# Patient Record
Sex: Female | Born: 1960 | Race: Black or African American | Hispanic: No | Marital: Married | Smoking: Never smoker
Health system: Southern US, Community
[De-identification: ages and names within clinical notes are randomized; demographics above are authoritative.]

## PROBLEM LIST (undated history)

## (undated) DIAGNOSIS — I10 Essential (primary) hypertension: Secondary | ICD-10-CM

## (undated) HISTORY — DX: Essential (primary) hypertension: I10

## (undated) HISTORY — PX: CHOLECYSTECTOMY: SHX55

---

## 1998-12-05 ENCOUNTER — Other Ambulatory Visit: Admission: RE | Admit: 1998-12-05 | Discharge: 1998-12-05 | Payer: Self-pay | Admitting: Obstetrics and Gynecology

## 1999-02-01 ENCOUNTER — Encounter: Admission: RE | Admit: 1999-02-01 | Discharge: 1999-05-02 | Payer: Self-pay | Admitting: Family Medicine

## 1999-06-05 ENCOUNTER — Emergency Department (HOSPITAL_COMMUNITY): Admission: EM | Admit: 1999-06-05 | Discharge: 1999-06-05 | Payer: Self-pay | Admitting: Emergency Medicine

## 1999-08-08 ENCOUNTER — Other Ambulatory Visit: Admission: RE | Admit: 1999-08-08 | Discharge: 1999-08-08 | Payer: Self-pay | Admitting: Obstetrics and Gynecology

## 1999-11-27 ENCOUNTER — Inpatient Hospital Stay (HOSPITAL_COMMUNITY): Admission: AD | Admit: 1999-11-27 | Discharge: 1999-11-27 | Payer: Self-pay | Admitting: Obstetrics and Gynecology

## 1999-11-27 ENCOUNTER — Encounter: Payer: Self-pay | Admitting: Obstetrics and Gynecology

## 2000-01-23 ENCOUNTER — Inpatient Hospital Stay (HOSPITAL_COMMUNITY): Admission: AD | Admit: 2000-01-23 | Discharge: 2000-01-26 | Payer: Self-pay | Admitting: Obstetrics and Gynecology

## 2000-01-26 ENCOUNTER — Encounter: Payer: Self-pay | Admitting: Obstetrics and Gynecology

## 2000-02-28 ENCOUNTER — Inpatient Hospital Stay (HOSPITAL_COMMUNITY): Admission: AD | Admit: 2000-02-28 | Discharge: 2000-03-02 | Payer: Self-pay | Admitting: Obstetrics and Gynecology

## 2000-02-28 ENCOUNTER — Encounter (INDEPENDENT_AMBULATORY_CARE_PROVIDER_SITE_OTHER): Payer: Self-pay | Admitting: Specialist

## 2000-03-04 ENCOUNTER — Encounter: Admission: RE | Admit: 2000-03-04 | Discharge: 2000-06-02 | Payer: Self-pay | Admitting: Obstetrics and Gynecology

## 2000-04-10 ENCOUNTER — Other Ambulatory Visit: Admission: RE | Admit: 2000-04-10 | Discharge: 2000-04-10 | Payer: Self-pay | Admitting: Obstetrics and Gynecology

## 2001-04-11 ENCOUNTER — Other Ambulatory Visit: Admission: RE | Admit: 2001-04-11 | Discharge: 2001-04-11 | Payer: Self-pay | Admitting: Obstetrics and Gynecology

## 2002-05-15 ENCOUNTER — Other Ambulatory Visit: Admission: RE | Admit: 2002-05-15 | Discharge: 2002-05-15 | Payer: Self-pay | Admitting: Obstetrics and Gynecology

## 2002-06-08 ENCOUNTER — Ambulatory Visit (HOSPITAL_COMMUNITY): Admission: RE | Admit: 2002-06-08 | Discharge: 2002-06-08 | Payer: Self-pay | Admitting: Obstetrics and Gynecology

## 2002-06-08 ENCOUNTER — Encounter: Payer: Self-pay | Admitting: Obstetrics and Gynecology

## 2003-01-25 ENCOUNTER — Encounter: Payer: Self-pay | Admitting: *Deleted

## 2003-01-25 ENCOUNTER — Inpatient Hospital Stay (HOSPITAL_COMMUNITY): Admission: AD | Admit: 2003-01-25 | Discharge: 2003-01-25 | Payer: Self-pay | Admitting: *Deleted

## 2003-03-20 ENCOUNTER — Encounter: Payer: Self-pay | Admitting: Emergency Medicine

## 2003-03-20 ENCOUNTER — Emergency Department (HOSPITAL_COMMUNITY): Admission: EM | Admit: 2003-03-20 | Discharge: 2003-03-20 | Payer: Self-pay | Admitting: Emergency Medicine

## 2003-09-27 ENCOUNTER — Other Ambulatory Visit: Admission: RE | Admit: 2003-09-27 | Discharge: 2003-09-27 | Payer: Self-pay | Admitting: Obstetrics and Gynecology

## 2004-05-09 ENCOUNTER — Emergency Department (HOSPITAL_COMMUNITY): Admission: EM | Admit: 2004-05-09 | Discharge: 2004-05-09 | Payer: Self-pay | Admitting: Emergency Medicine

## 2004-06-14 ENCOUNTER — Ambulatory Visit (HOSPITAL_COMMUNITY): Admission: RE | Admit: 2004-06-14 | Discharge: 2004-06-14 | Payer: Self-pay | Admitting: Obstetrics and Gynecology

## 2004-09-20 ENCOUNTER — Emergency Department (HOSPITAL_COMMUNITY): Admission: EM | Admit: 2004-09-20 | Discharge: 2004-09-20 | Payer: Self-pay | Admitting: Emergency Medicine

## 2006-07-31 ENCOUNTER — Ambulatory Visit (HOSPITAL_COMMUNITY): Admission: RE | Admit: 2006-07-31 | Discharge: 2006-07-31 | Payer: Self-pay | Admitting: Obstetrics and Gynecology

## 2006-08-06 ENCOUNTER — Emergency Department (HOSPITAL_COMMUNITY): Admission: EM | Admit: 2006-08-06 | Discharge: 2006-08-07 | Payer: Self-pay | Admitting: Emergency Medicine

## 2008-06-03 ENCOUNTER — Ambulatory Visit (HOSPITAL_COMMUNITY): Admission: RE | Admit: 2008-06-03 | Discharge: 2008-06-03 | Payer: Self-pay | Admitting: Obstetrics and Gynecology

## 2010-09-11 ENCOUNTER — Ambulatory Visit (HOSPITAL_COMMUNITY): Admission: RE | Admit: 2010-09-11 | Discharge: 2010-09-11 | Payer: Self-pay | Admitting: Obstetrics and Gynecology

## 2010-10-22 ENCOUNTER — Ambulatory Visit: Payer: Self-pay | Admitting: Diagnostic Radiology

## 2010-10-22 ENCOUNTER — Emergency Department (HOSPITAL_BASED_OUTPATIENT_CLINIC_OR_DEPARTMENT_OTHER): Admission: EM | Admit: 2010-10-22 | Discharge: 2010-10-22 | Payer: Self-pay | Admitting: Emergency Medicine

## 2010-10-24 ENCOUNTER — Observation Stay (HOSPITAL_COMMUNITY): Admission: EM | Admit: 2010-10-24 | Discharge: 2010-10-26 | Payer: Self-pay | Admitting: Emergency Medicine

## 2010-10-25 ENCOUNTER — Encounter (INDEPENDENT_AMBULATORY_CARE_PROVIDER_SITE_OTHER): Payer: Self-pay

## 2011-03-08 ENCOUNTER — Encounter: Payer: Self-pay | Admitting: Internal Medicine

## 2011-03-13 NOTE — Letter (Signed)
Summary: Pre Visit Letter Revised  [facility]  [address] [place], [place] [address]   Phone: [phone]  Fax: [phone]        03/08/2011 MRN: [id number]  Sydney Mcclain [address] [place], [place]  [address]             Procedure Date:  04-19-11 10:30am           Dr Juanda Chance   Direct Colon   Welcome to the Gastroenterology Division at [facility].    You are scheduled to see a nurse for your pre-procedure visit on 04-05-11 at 10am on the 3rd floor at [facility], [address]  We ask that you try to arrive at our office 15 minutes prior to your appointment time to allow for check-in.  Please take a minute to review the attached form.  If you answer "Yes" to one or more of the questions on the first page, we ask that you call the person listed at your earliest opportunity.  If you answer "No" to all of the questions, please complete the rest of the form and bring it to your appointment.    Your nurse visit will consist of discussing your medical and surgical history, your immediate family medical history, and your medications.   If you are unable to list all of your medications on the form, please bring the medication bottles to your appointment and we will list them.  We will need to be aware of both prescribed and over the counter drugs.  We will need to know exact dosage information as well.    Please be prepared to read and sign documents such as consent forms, a financial agreement, and acknowledgement forms.  If necessary, and with your consent, a friend or relative is welcome to sit-in on the nurse visit with you.  Please bring your insurance card so that we may make a copy of it.  If your insurance requires a referral to see a specialist, please bring your referral form from your primary care physician.  No co-pay is required for this nurse visit.     If you cannot keep your appointment, please call [phone] to cancel or reschedule prior to  your appointment date.  This allows [place] the opportunity to schedule an appointment for another patient in need of care.    Thank you for choosing [facility] for your medical needs.  We appreciate the opportunity to care for you.  Please visit [place] at our website  to learn more about our practice.  Sincerely, The Gastroenterology Division

## 2011-03-14 LAB — URINALYSIS, ROUTINE W REFLEX MICROSCOPIC
Bilirubin Urine: NEGATIVE
Glucose, UA: NEGATIVE mg/dL
Ketones, ur: NEGATIVE mg/dL
Nitrite: NEGATIVE
Protein, ur: NEGATIVE mg/dL
Specific Gravity, Urine: 1.026 (ref 1.005–1.030)

## 2011-03-14 LAB — COMPREHENSIVE METABOLIC PANEL
ALT: 18 U/L (ref 0–35)
Albumin: 3.5 g/dL (ref 3.5–5.2)
Albumin: 4.4 g/dL (ref 3.5–5.2)
Alkaline Phosphatase: 94 U/L (ref 39–117)
Alkaline Phosphatase: 161 U/L — ABNORMAL HIGH (ref 39–117)
BUN: 11 mg/dL (ref 6–23)
CO2: 30 mEq/L (ref 19–32)
CO2: 30 mEq/L (ref 19–32)
Chloride: 95 mEq/L — ABNORMAL LOW (ref 96–112)
Creatinine, Ser: 0.98 mg/dL (ref 0.4–1.2)
GFR calc Af Amer: >60 mL/min (ref >60)
GFR calc Af Amer: >60 mL/min (ref >60)
GFR calc non Af Amer: 60 mL/min — ABNORMAL LOW (ref >60)
GFR calc non Af Amer: >60 mL/min (ref >60)
Glucose, Bld: 109 mg/dL — ABNORMAL HIGH (ref 70–99)
Glucose, Bld: 117 mg/dL — ABNORMAL HIGH (ref 70–99)
Potassium: 2.5 mEq/L — CL (ref 3.5–5.1)
Potassium: 3.1 mEq/L — ABNORMAL LOW (ref 3.5–5.1)
Sodium: 138 mEq/L (ref 135–145)
Sodium: 137 mEq/L (ref 135–145)
Total Bilirubin: 0.9 mg/dL (ref 0.3–1.2)
Total Bilirubin: 1 mg/dL (ref 0.3–1.2)
Total Bilirubin: 0.8 mg/dL (ref 0.3–1.2)
Total Protein: 8.3 g/dL (ref 6.0–8.3)

## 2011-03-14 LAB — CBC
HCT: 30.8 % — ABNORMAL LOW (ref 36.0–46.0)
HCT: 33.8 % — ABNORMAL LOW (ref 36.0–46.0)
MCHC: 33.2 g/dL (ref 30.0–36.0)
MCHC: 32.7 g/dL (ref 30.0–36.0)
MCV: 81.1 fL (ref 78.0–100.0)
MCV: 81.8 fL (ref 78.0–100.0)
Platelets: 350 10*3/uL (ref 150–400)
Platelets: 462 10*3/uL — ABNORMAL HIGH (ref 150–400)
RDW: 14.9 % (ref 11.5–15.5)
RDW: 13.9 % (ref 11.5–15.5)

## 2011-03-14 LAB — DIFFERENTIAL
Basophils Relative: 0 % (ref 0–1)
Basophils Relative: 0 % (ref 0–1)
Eosinophils Absolute: 0 10*3/uL (ref 0.0–0.7)
Eosinophils Relative: 0 % (ref 0–5)
Lymphocytes Relative: 17 % (ref 12–46)
Lymphs Abs: 2.6 10*3/uL (ref 0.7–4.0)
Lymphs Abs: 2.5 10*3/uL (ref 0.7–4.0)
Monocytes Absolute: 1.1 10*3/uL — ABNORMAL HIGH (ref 0.1–1.0)
Neutrophils Relative %: 76 % (ref 43–77)
Neutrophils Relative %: 76 % (ref 43–77)

## 2011-03-14 LAB — URINE MICROSCOPIC-ADD ON

## 2011-03-27 ENCOUNTER — Other Ambulatory Visit: Payer: Self-pay | Admitting: Internal Medicine

## 2011-04-05 ENCOUNTER — Ambulatory Visit (AMBULATORY_SURGERY_CENTER): Payer: BC Managed Care – PPO | Admitting: *Deleted

## 2011-04-05 VITALS — Ht 69.0 in | Wt 251.1 lb

## 2011-04-05 DIAGNOSIS — Z1211 Encounter for screening for malignant neoplasm of colon: Secondary | ICD-10-CM

## 2011-04-05 MED ORDER — PEG-KCL-NACL-NASULF-NA ASC-C 100 G PO SOLR: ORAL | Status: DC

## 2011-04-19 ENCOUNTER — Other Ambulatory Visit: Payer: Self-pay | Admitting: Internal Medicine

## 2011-05-14 ENCOUNTER — Encounter: Payer: Self-pay | Admitting: *Deleted

## 2011-05-14 ENCOUNTER — Telehealth: Payer: Self-pay | Admitting: Internal Medicine

## 2011-05-14 NOTE — Telephone Encounter (Signed)
Pt has lost prep instructions.  Procedure date has changed.  Revised prep instructions mailed to pt.  Attempted to leave message for pt, voice mailbox is full. Ezra Sites

## 2011-05-17 ENCOUNTER — Other Ambulatory Visit: Payer: Self-pay | Admitting: Internal Medicine

## 2011-05-18 NOTE — Discharge Summary (Signed)
[facility]  Patient:    Sydney Mcclain                         MRN: [id number] Adm. Date:  [id number] Disc. Date: [id number] Attending:  Silverio Lay A                           Discharge Summary  REASON FOR ADMISSION:         1. Intrauterine pregnancy at 33+ weeks.                               2. Oligohydramnios.                               3. Chronic hypertension.  HISTORY OF PRESENT ILLNESS:   This is a 50 year old married African-American woman, gravida 1, being admitted at 33 weeks 2 days after being evaluated in the office with a decreased amniotic fluid index at 8.2 or 5th percentile and 6.6 or 3rd percentile.  The patient is known for chronic hypertension and is currently being treated with Aldamet 500 mg t.i.d.  She is admitted for complete bed rest, IV hydration, and repeat ultrasound in 48 hours.  HOSPITAL COURSE:              During her admission, she was on continuous monitoring which was always reactive with absence of any decellerations and a baseline down to 130 to 140 beats per minute.  Her Aldamet was decreased to 500 mg b.i.d. and she maintained blood pressure in 140 to 150 over 78 to 91.  Her repeat ultrasound on January 26, 2000, after complete 48 hours of IV fluids revealed a  normal amniotic fluid index at 11.7.  She was discharged home with maintained bed rest, maintained out of work, continue Aldamet 500 mg b.i.d., continue monitoring blood pressure at home, and follow up in the office within the next week with repeat amniotic fluid index study. DD:  02/07/00 TD:  02/07/00 Job: [id number]

## 2011-06-15 ENCOUNTER — Encounter: Payer: Self-pay | Admitting: Internal Medicine

## 2011-06-15 ENCOUNTER — Ambulatory Visit (AMBULATORY_SURGERY_CENTER): Payer: BC Managed Care – PPO | Admitting: Internal Medicine

## 2011-06-15 VITALS — BP 156/92 | HR 120 | Temp 100.5°F | Resp 15 | Ht 69.0 in | Wt 249.0 lb

## 2011-06-15 DIAGNOSIS — Z1211 Encounter for screening for malignant neoplasm of colon: Secondary | ICD-10-CM

## 2011-06-15 MED ORDER — SODIUM CHLORIDE 0.9 % IV SOLN: 500.0000 mL | INTRAVENOUS | Status: AC

## 2011-06-15 MED ORDER — DIPHENHYDRAMINE HCL 50 MG/ML IJ SOLN: 12.5000 mg | Freq: Once | INTRAMUSCULAR | Status: AC

## 2011-06-15 NOTE — Patient Instructions (Signed)
Your procedure was normal.   You will be due for another colonoscopy in 10 years.   Please, resume all of your routine medications.  You also needs to get a sleep test for sleep apnea.  Please, call your primary care doctor for a referral.  Read the handout given to you by your recovery room nurse.   If you have any questions, please call us at [phone].  Thank-you.

## 2011-06-18 ENCOUNTER — Telehealth: Payer: Self-pay | Admitting: *Deleted

## 2011-06-18 NOTE — Telephone Encounter (Signed)
Attempted cell phone/home number. Mailbox full and cannot accept new messages at this time. Attempted work number, no answer.

## 2011-06-19 ENCOUNTER — Encounter: Payer: Self-pay | Admitting: Internal Medicine

## 2011-06-19 NOTE — Progress Notes (Signed)
-----   Message -----    From: Hart Carwin, MD    Sent: 06/18/2011  10:12 PM      To: Daphine Deutscher, RN Subject: sleep apnea study                              No need to schedule sleep study. Pt will contact her PCP for that. ----- Message -----    From: Daphine Deutscher, RN    Sent: 06/18/2011   9:25 AM      To: Hart Carwin, MD  On patient's colonoscopy report on Friday, it recommends a sleep apnea evaluation. Do I need to set this up for patient? Rene Kocher

## 2011-10-15 ENCOUNTER — Encounter (HOSPITAL_BASED_OUTPATIENT_CLINIC_OR_DEPARTMENT_OTHER): Payer: Self-pay | Admitting: Emergency Medicine

## 2011-10-15 ENCOUNTER — Emergency Department (HOSPITAL_BASED_OUTPATIENT_CLINIC_OR_DEPARTMENT_OTHER)
Admission: EM | Admit: 2011-10-15 | Discharge: 2011-10-16 | Disposition: A | Discharge status: Home Health | Payer: BC Managed Care – PPO | Attending: Emergency Medicine | Admitting: Emergency Medicine

## 2011-10-15 DIAGNOSIS — F411 Generalized anxiety disorder: Secondary | ICD-10-CM | POA: Insufficient documentation

## 2011-10-15 DIAGNOSIS — R002 Palpitations: Secondary | ICD-10-CM | POA: Insufficient documentation

## 2011-10-15 DIAGNOSIS — E876 Hypokalemia: Secondary | ICD-10-CM

## 2011-10-15 DIAGNOSIS — I10 Essential (primary) hypertension: Secondary | ICD-10-CM | POA: Insufficient documentation

## 2011-10-15 NOTE — ED Provider Notes (Addendum)
History     CSN: [id number] Arrival date & time: 10/15/2011 11:48 PM  Chief Complaint  Patient presents with  . Anxiety    (Consider location/radiation/quality/duration/timing/severity/associated sxs/prior treatment) HPI Comments: Patient was lying in bed, almost asleep when she started feeling her heart racing and became startled.  She became anxious about this, and then became worried and came in to the emergency department.  She still feels that her heart is racing.  She has no chest pain or shortness of breath.  No nausea or vomiting.  No diaphoresis.  Patient is also worried about her electrolytes since her potassium was low a year ago.  They've since changed her blood pressure medication to a different diuretic.  Patient also notes that she's been doing a new low-carb diet.  Patient does note that her symptoms are slowly getting better without further intervention.  Patient is a 50 y.o. female presenting with anxiety. The history is provided by the patient. No language interpreter was used.  Anxiety This is a new problem. The current episode started less than 1 hour ago. The problem has been gradually improving. Pertinent negatives include no chest pain, no abdominal pain, no headaches and no shortness of breath. The symptoms are aggravated by nothing. The symptoms are relieved by relaxation. She has tried nothing for the symptoms.    Past Medical History  Diagnosis Date  . Hypertension     Past Surgical History  Procedure Date  . Cholecystectomy     Family History  Problem Relation Age of Onset  . Diabetes Mother     History  Substance Use Topics  . Smoking status: Never Smoker   . Smokeless tobacco: Never Used  . Alcohol Use: No    OB History    Grav Para Term Preterm Abortions TAB SAB Ect Mult Living                  Review of Systems  Constitutional: Negative.  Negative for fever and chills.  HENT: Negative.   Eyes: Negative.  Negative for discharge and  redness.  Respiratory: Negative.  Negative for cough and shortness of breath.   Cardiovascular: Positive for palpitations. Negative for chest pain.  Gastrointestinal: Negative.  Negative for nausea, vomiting, abdominal pain and diarrhea.  Genitourinary: Negative.  Negative for dysuria and vaginal discharge.  Musculoskeletal: Negative.  Negative for back pain.  Skin: Negative.  Negative for color change and rash.  Neurological: Negative.  Negative for syncope and headaches.  Hematological: Negative.  Negative for adenopathy.  Psychiatric/Behavioral: Negative for confusion.  All other systems reviewed and are negative.    Allergies  Review of patient's allergies indicates no known allergies.  Home Medications   Current Outpatient Rx  Name Route Sig Dispense Refill  . VITAMIN D 1000 UNITS PO CAPS Oral Take 1,000 Units by mouth daily.      . SUPER HIGH VITAMINS/MINERALS PO TABS Oral Take 1 tablet by mouth daily.      . TRIAMTERENE-HCTZ 37.5-25 MG PO TABS Oral Take 1 tablet by mouth daily.        BP 156/96  Pulse 110  Temp(Src) 98.5 F (36.9 C) (Oral)  Resp 20  Ht 5\' 9"  (1.753 m)  Wt 245 lb (111.131 kg)  BMI 36.18 kg/m2  SpO2 100%  Physical Exam  Constitutional: She is oriented to person, place, and time. She appears well-developed and well-nourished.  Non-toxic appearance. She does not have a sickly appearance.  HENT:  Head: Normocephalic and atraumatic.  Eyes: Conjunctivae, EOM and lids are normal. Pupils are equal, round, and reactive to light. No scleral icterus.  Neck: Trachea normal and normal range of motion. Neck supple.  Cardiovascular: Regular rhythm, S1 normal, S2 normal, normal heart sounds and normal pulses.  Tachycardia present.  Exam reveals no gallop.   No murmur heard. Pulmonary/Chest: Effort normal and breath sounds normal. No respiratory distress. She has no wheezes. She has no rales. She exhibits no tenderness.  Abdominal: Soft. Normal appearance. There is  no tenderness. There is no rebound, no guarding and no CVA tenderness.  Musculoskeletal: Normal range of motion.  Neurological: She is alert and oriented to person, place, and time. She has normal strength.  Skin: Skin is warm, dry and intact. No rash noted.  Psychiatric: She has a normal mood and affect. Her behavior is normal. Judgment and thought content normal.       Patient appears anxious.    ED Course  Procedures (including critical care time)  Results for orders placed during the hospital encounter of 10/15/11  BASIC METABOLIC PANEL      Component Value Range   Sodium 139  135 - 145 (mEq/L)   Potassium 3.1 (*) 3.5 - 5.1 (mEq/L)   Chloride 100  96 - 112 (mEq/L)   CO2 29  19 - 32 (mEq/L)   Glucose, Bld 129 (*) 70 - 99 (mg/dL)   BUN 23  6 - 23 (mg/dL)   Creatinine, Ser 4.54  0.50 - 1.10 (mg/dL)   Calcium 9.5  8.4 - 09.8 (mg/dL)   GFR calc non Af Amer 64 (*) >90 (mL/min)   GFR calc Af Amer 74 (*) >90 (mL/min)   No results found.   Date: 10/16/2011  Rate: 106  Rhythm: sinus tachycardia  QRS Axis: normal  Intervals: normal  ST/T Wave abnormalities: normal  Conduction Disutrbances:none  Narrative Interpretation:   Old EKG Reviewed: unchanged from 10/22/2010     MDM  Patient here with palpitations.  She has no specific symptoms consistent with ACS.  She has mildly low potassium which has been replenished here.  This is unlikely to have significantly impacted her heart rate and cause any palpitations. She did appear anxious but was improving while here in the emergency department.  Given that patient otherwise appears well and is afebrile I feel she is safe for discharge home at this point in time.        Nat Christen, MD 10/16/11 0144  Patient's repeat heart rate on my exam is 85.  Nat Christen, MD 10/16/11 [phone]

## 2011-10-16 ENCOUNTER — Other Ambulatory Visit: Payer: Self-pay

## 2011-10-16 ENCOUNTER — Encounter (HOSPITAL_BASED_OUTPATIENT_CLINIC_OR_DEPARTMENT_OTHER): Payer: Self-pay | Admitting: *Deleted

## 2011-10-16 LAB — BASIC METABOLIC PANEL
BUN: 23 mg/dL (ref 6–23)
Chloride: 100 mEq/L (ref 96–112)
GFR calc Af Amer: 74 mL/min — ABNORMAL LOW (ref >90)
GFR calc non Af Amer: 64 mL/min — ABNORMAL LOW (ref >90)
Potassium: 3.1 mEq/L — ABNORMAL LOW (ref 3.5–5.1)

## 2011-10-16 MED ORDER — POTASSIUM CHLORIDE CRYS ER 20 MEQ PO TBCR
40.0000 meq | EXTENDED_RELEASE_TABLET | Freq: Once | ORAL | Status: AC
  Administered 2011-10-16: 40 meq via ORAL
  Filled 2011-10-16: qty 2

## 2012-01-08 ENCOUNTER — Emergency Department (INDEPENDENT_AMBULATORY_CARE_PROVIDER_SITE_OTHER)
Admission: EM | Admit: 2012-01-08 | Discharge: 2012-01-08 | Disposition: A | Discharge status: Home / Self Care | Payer: BC Managed Care – PPO | Source: Home / Self Care | Attending: Emergency Medicine | Admitting: Emergency Medicine

## 2012-01-08 ENCOUNTER — Encounter (HOSPITAL_COMMUNITY): Payer: Self-pay | Admitting: Emergency Medicine

## 2012-01-08 DIAGNOSIS — W540XXA Bitten by dog, initial encounter: Secondary | ICD-10-CM

## 2012-01-08 DIAGNOSIS — T148XXA Other injury of unspecified body region, initial encounter: Secondary | ICD-10-CM

## 2012-01-08 MED ORDER — RABIES VACCINE, PCEC IM SUSR
1.0000 mL | Freq: Once | INTRAMUSCULAR | Status: AC
  Administered 2012-01-08: 1 mL via INTRAMUSCULAR

## 2012-01-08 NOTE — ED Provider Notes (Signed)
History     CSN: [id number]  Arrival date & time 01/08/12  8657   First MD Initiated Contact with Patient 01/08/12 [phone]      Chief Complaint  Patient presents with  . Rabies Injection    (Consider location/radiation/quality/duration/timing/severity/associated sxs/prior treatment) HPI Comments: The patient is a 51 year old female who is here for rabies injection. She was bitten by a stray dog on December 28th in [place] on the left leg. She got her first rabies vaccine on that day including local infiltration with rabies immune globulin. She was given Augmentin 875 mg a day and her last tetanus vaccine was less than a year ago. She got her second rabies vaccine on the 31st and returns today for her third. The bite wound is healing up well without any evidence of infection. It's only minimally tender to palpation. She still does have a scab covering the wound.   Past Medical History  Diagnosis Date  . Hypertension     Past Surgical History  Procedure Date  . Cholecystectomy     Family History  Problem Relation Age of Onset  . Diabetes Mother     History  Substance Use Topics  . Smoking status: Never Smoker   . Smokeless tobacco: Never Used  . Alcohol Use: No    OB History    Grav Para Term Preterm Abortions TAB SAB Ect Mult Living                  Review of Systems  Musculoskeletal: Negative for myalgias, back pain, joint swelling, arthralgias and gait problem.  Skin: Negative for rash and wound.  Neurological: Negative for weakness and numbness.    Allergies  Review of patient's allergies indicates no known allergies.  Home Medications   Current Outpatient Rx  Name Route Sig Dispense Refill  . TRIAMTERENE-HCTZ 37.5-25 MG PO TABS Oral Take 1 tablet by mouth daily.      Marland Kitchen VITAMIN D 1000 UNITS PO CAPS Oral Take 1,000 Units by mouth daily.      . SUPER HIGH VITAMINS/MINERALS PO TABS Oral Take 1 tablet by mouth daily.        BP 164/96  Pulse 112  Temp(Src)  98.7 F (37.1 C) (Oral)  Resp 20  SpO2 100%  Physical Exam  Nursing note and vitals reviewed. Constitutional: She is oriented to person, place, and time. She appears well-developed and well-nourished. No distress.  Musculoskeletal: Normal range of motion. She exhibits no edema and no tenderness.       There is a small puncture wound on the lateral aspect of the left lower leg which is covered by a scab. There is no surrounding erythema, no purulent drainage, no tenderness to palpation.   Neurological: She is alert and oriented to person, place, and time. She has normal strength and normal reflexes. She displays no atrophy. No sensory deficit. She exhibits normal muscle tone.  Skin: Skin is warm and dry. No rash noted. She is not diaphoretic.    ED Course  Procedures (including critical care time)  Labs Reviewed - No data to display No results found.   1. Dog bite       MDM  The wound is healing up well. She was given her third rabies vaccine today. The recommendation is for her to her fourth on January 12 and her final on your the 26.        Roque Lias, MD 01/08/12 [phone]

## 2012-01-08 NOTE — ED Notes (Signed)
PT STARTED RABIES VACCINE SERIES OUT OF STATE IN [place] @ [facility] ER ON 12/28/2011 WITH F/U 12/31/11, AND WAS TOLD TO RETURN FOR NEXT INJECTION 01/07/12,01/15/12.PT F/U DATES WERE OFF BUT WITH TALKING TO FLOW MANAGER I WAS INFORMED TO TELL PT TO RETURN ON 01/12/12 TO BE ON TRACK.DR. KELLER EXPLAINED TO PT AND SHE VERBALIZED UNDERSTANDING.WILL RETURN SATURDAY

## 2012-01-08 NOTE — ED Notes (Signed)
HERE FOR RABIES INJESTION S/P DOG BITE 12/28/11 WHILE VACATIONING IN [place].PT RECEIVED X 2 INJECTION OUT OF STATE AND HERE FRO 3RD INJECTION.HX HTN CONTROLLED BY PRESCRIBED MEDS.DENIES PAIN

## 2012-01-12 ENCOUNTER — Encounter (HOSPITAL_COMMUNITY): Payer: Self-pay | Admitting: *Deleted

## 2012-01-12 ENCOUNTER — Emergency Department (HOSPITAL_COMMUNITY)
Admission: EM | Admit: 2012-01-12 | Discharge: 2012-01-12 | Disposition: A | Discharge status: Home / Self Care | Payer: BC Managed Care – PPO | Source: Home / Self Care

## 2012-01-12 MED ORDER — RABIES VACCINE, PCEC IM SUSR
1.0000 mL | Freq: Once | INTRAMUSCULAR | Status: AC
  Administered 2012-01-12: 1 mL via INTRAMUSCULAR

## 2012-01-12 MED ORDER — RABIES VACCINE, PCEC IM SUSR
INTRAMUSCULAR | Status: AC
  Filled 2012-01-12: qty 1

## 2012-01-12 NOTE — ED Notes (Signed)
Pt here for final rabies vaccine  

## 2012-07-21 ENCOUNTER — Other Ambulatory Visit (HOSPITAL_COMMUNITY): Payer: Self-pay | Admitting: Obstetrics and Gynecology

## 2012-07-21 DIAGNOSIS — Z1231 Encounter for screening mammogram for malignant neoplasm of breast: Secondary | ICD-10-CM

## 2012-08-06 ENCOUNTER — Ambulatory Visit (HOSPITAL_COMMUNITY): Payer: BC Managed Care – PPO

## 2012-08-22 ENCOUNTER — Ambulatory Visit (HOSPITAL_COMMUNITY)
Admission: RE | Admit: 2012-08-22 | Discharge: 2012-08-22 | Disposition: A | Discharge status: Home / Self Care | Payer: BC Managed Care – PPO | Source: Ambulatory Visit | Attending: Obstetrics and Gynecology | Admitting: Obstetrics and Gynecology

## 2012-08-22 DIAGNOSIS — Z1231 Encounter for screening mammogram for malignant neoplasm of breast: Secondary | ICD-10-CM

## 2014-08-20 ENCOUNTER — Other Ambulatory Visit: Payer: Self-pay

## 2014-08-20 DIAGNOSIS — Z1231 Encounter for screening mammogram for malignant neoplasm of breast: Secondary | ICD-10-CM

## 2014-08-31 ENCOUNTER — Ambulatory Visit
Admission: RE | Admit: 2014-08-31 | Discharge: 2014-08-31 | Disposition: A | Discharge status: Home / Self Care | Payer: BC Managed Care – PPO | Source: Ambulatory Visit

## 2014-08-31 DIAGNOSIS — Z1231 Encounter for screening mammogram for malignant neoplasm of breast: Secondary | ICD-10-CM

## 2020-09-15 ENCOUNTER — Other Ambulatory Visit: Payer: Self-pay | Admitting: Family Medicine

## 2020-09-15 DIAGNOSIS — Z1231 Encounter for screening mammogram for malignant neoplasm of breast: Secondary | ICD-10-CM

## 2020-09-29 ENCOUNTER — Other Ambulatory Visit: Payer: Self-pay

## 2020-09-29 ENCOUNTER — Ambulatory Visit
Admission: RE | Admit: 2020-09-29 | Discharge: 2020-09-29 | Disposition: A | Discharge status: Home / Self Care | Payer: BC Managed Care – PPO | Source: Ambulatory Visit | Attending: Family Medicine | Admitting: Family Medicine

## 2020-09-29 DIAGNOSIS — Z1231 Encounter for screening mammogram for malignant neoplasm of breast: Secondary | ICD-10-CM

## 2021-04-29 IMAGING — MG DIGITAL SCREENING BILAT W/ TOMO W/ CAD
8 of 14 series · 8 of 40 positions shown · non-contrast
Comparison: Previous exam(s).

ACR Breast Density Category a: The breast tissue is almost entirely
fatty.

CLINICAL DATA: Screening.

EXAM:
DIGITAL SCREENING BILATERAL MAMMOGRAM WITH TOMO AND CAD

[L MLO synth-2D (1 of 2)]
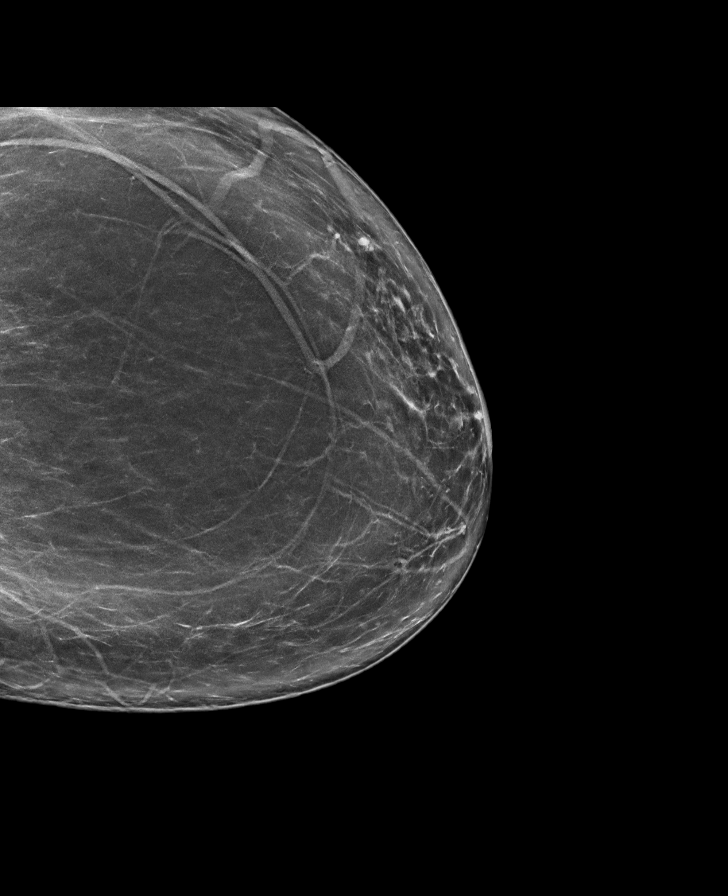

[R CC synth-2D (1 of 2)]
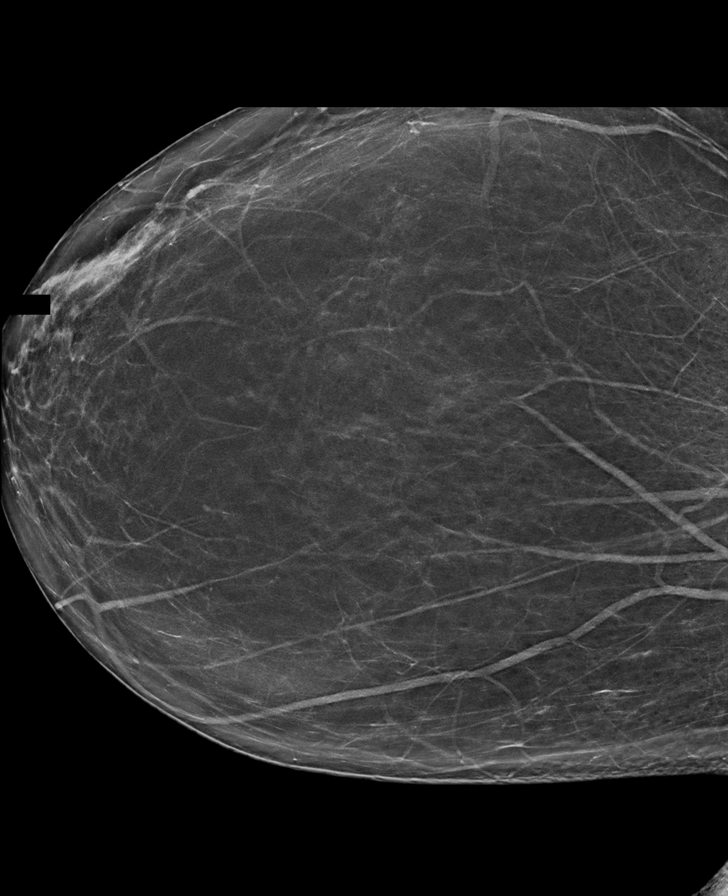

[R MLO synth-2D (1 of 2)]
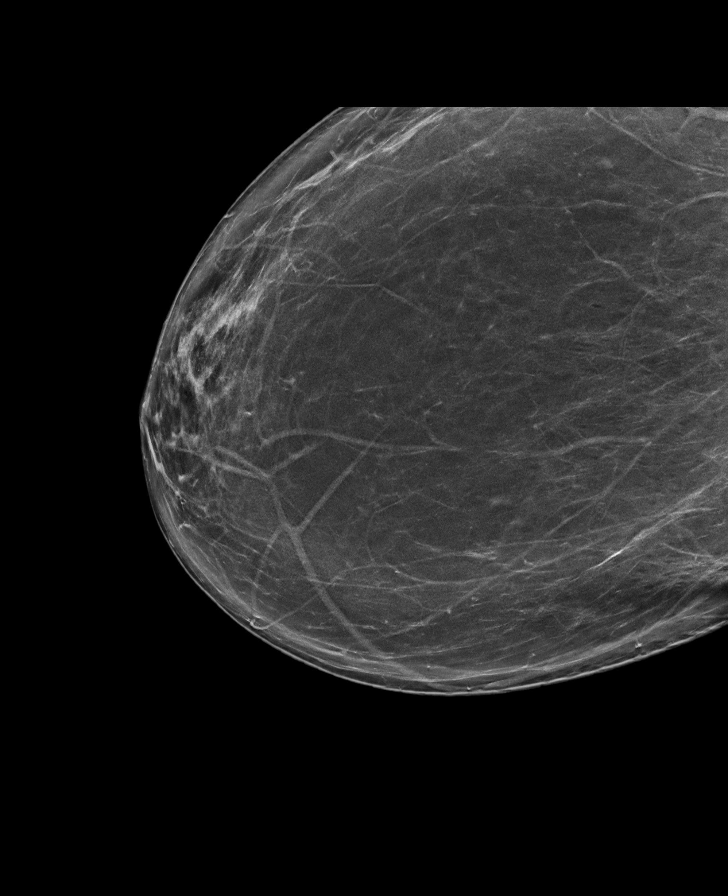

[L CC synth-2D]
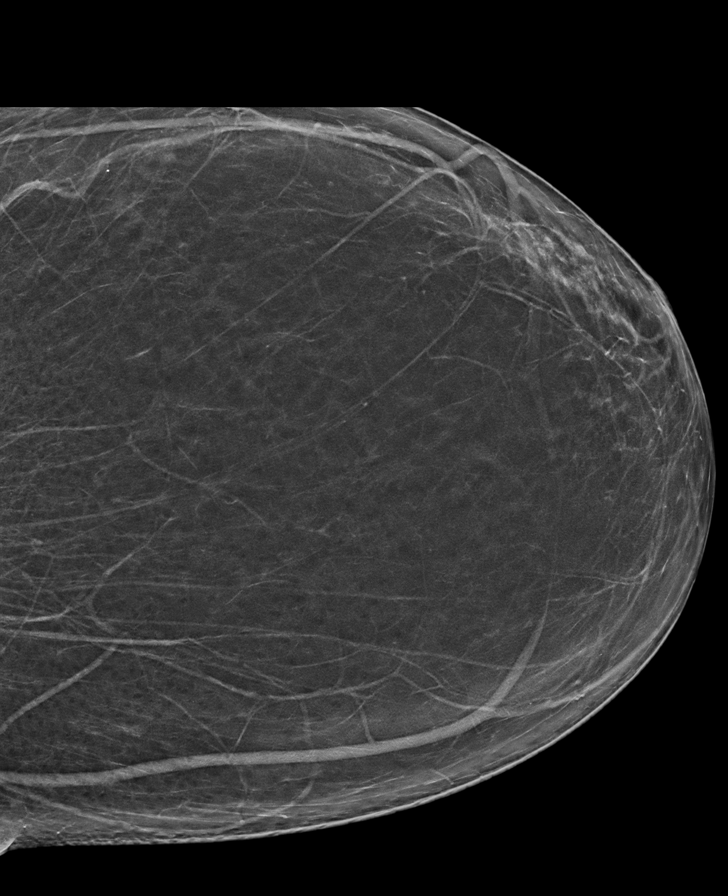

[R MLO synth-2D (2 of 2)]
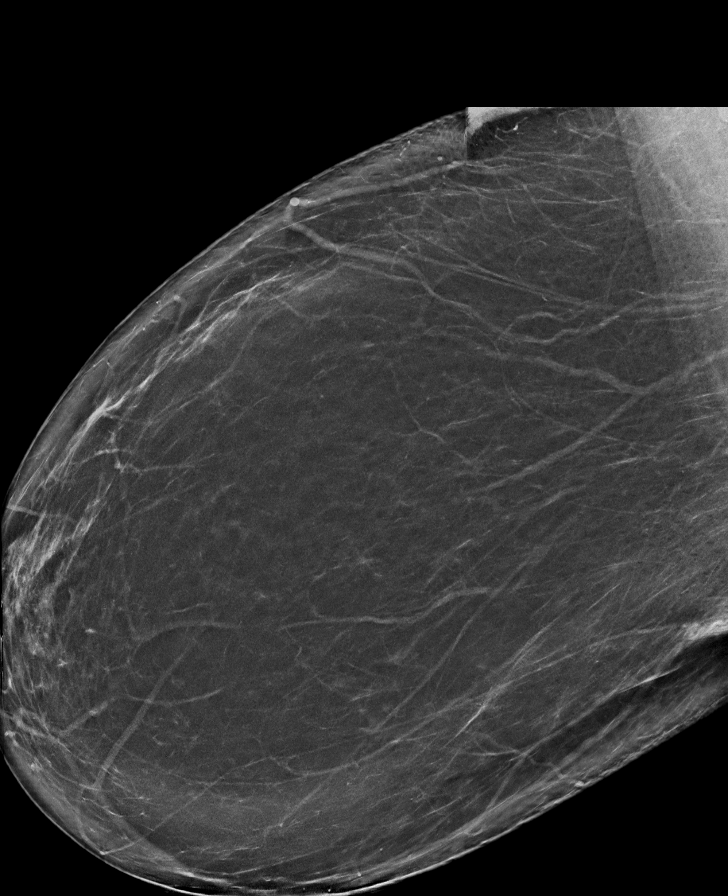

[R CC synth-2D (2 of 2)]
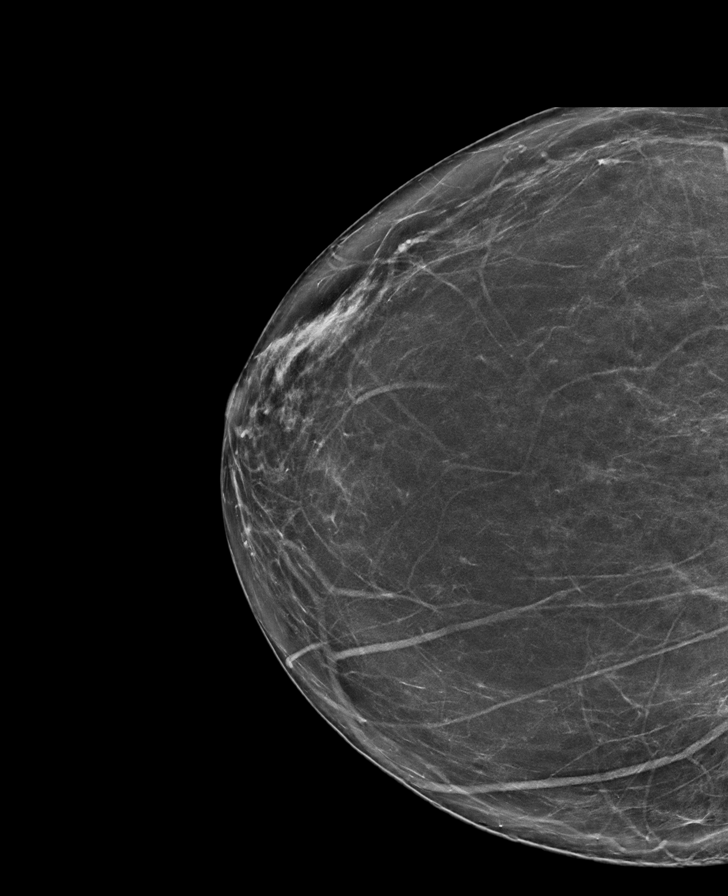

[L MLO synth-2D (2 of 2)]
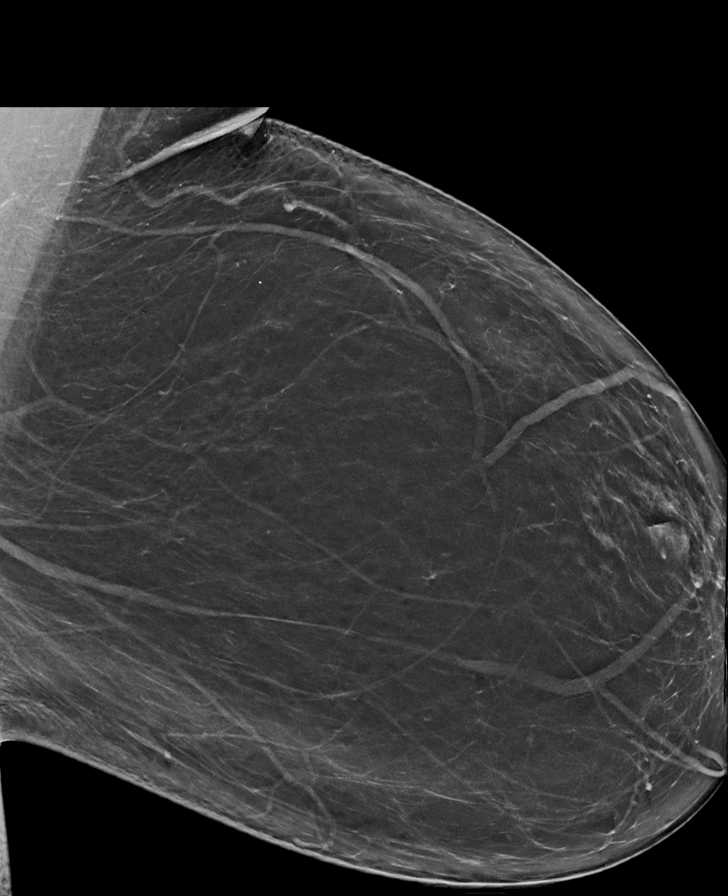

[R CC tomo · tomo slice 40/79.0]
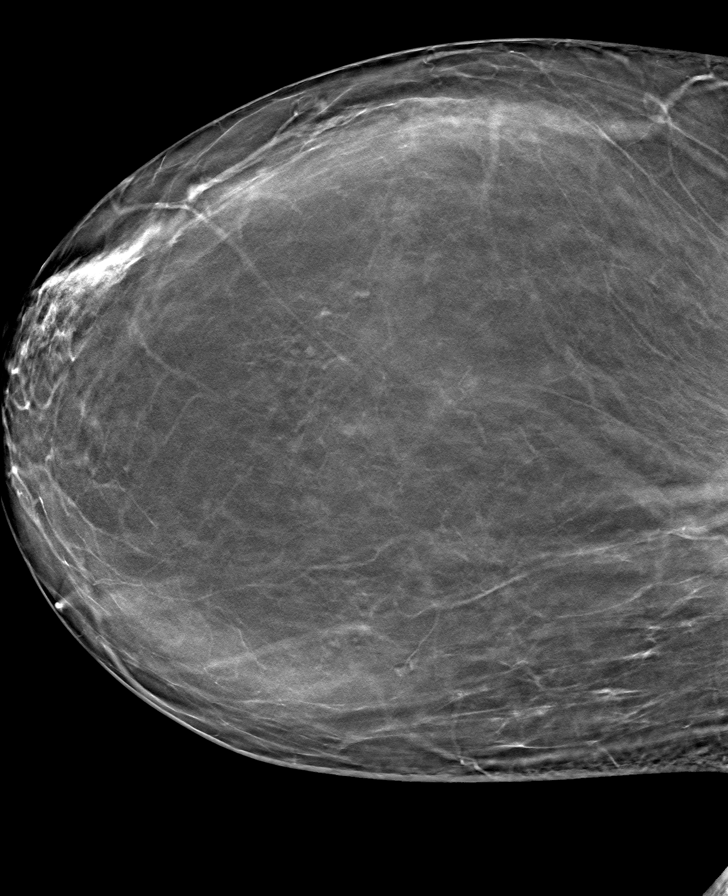

[8 of 40 positions shown; findings below may reference images not displayed]

FINDINGS: There are no findings suspicious for malignancy. Images were
processed with CAD.
IMPRESSION: No mammographic evidence of malignancy. A result letter of this
screening mammogram will be mailed directly to the patient.

RECOMMENDATION:
Screening mammogram in one year. (Code:8Y-Q-VVS)

BI-RADS CATEGORY  1: Negative.

## 2021-09-25 ENCOUNTER — Encounter: Payer: Self-pay | Admitting: Gastroenterology

## 2022-01-10 ENCOUNTER — Other Ambulatory Visit: Payer: Self-pay | Admitting: Family Medicine

## 2022-01-10 DIAGNOSIS — Z1231 Encounter for screening mammogram for malignant neoplasm of breast: Secondary | ICD-10-CM

## 2023-01-07 ENCOUNTER — Other Ambulatory Visit: Payer: Self-pay | Admitting: Family Medicine

## 2023-01-07 DIAGNOSIS — Z1231 Encounter for screening mammogram for malignant neoplasm of breast: Secondary | ICD-10-CM

## 2023-01-30 ENCOUNTER — Ambulatory Visit: Payer: Self-pay | Admitting: Surgery

## 2023-01-30 NOTE — H&P (Signed)
  Sydney Mcclain [id number]   Referring Provider:  Thyra Breed, MD   Subjective   Chief Complaint: New Consultation (Lipoma under Right arm )     History of Present Illness:    Very pleasant 62 year old woman with history of hypertension, hyperlipidemia, diabetes referred for evaluation of a possible lipoma under her right arm.  It has been present for quite some time, and may have slightly increased in size.  It is not painful but it does bother her.  She has had other smaller lipomas along her forearm which she states went away on their own.  No previous surgery or procedures in this area.  She had a laparoscopic cholecystectomy done emergently by Dr. Georgette Dover in 2011.   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    Review of Systems: A complete review of systems was obtained from the patient.  I have reviewed this information and discussed as appropriate with the patient.  See HPI as well for other ROS.   Medical History: Past Medical History:  Diagnosis Date   Arthritis    Diabetes mellitus without complication (CMS-HCC)    Hyperlipidemia    Hypertension    Thyroid disease     There is no problem list on file for this patient.   Past Surgical History:  Procedure Laterality Date   CHOLECYSTECTOMY       No Known Allergies  Current Outpatient Medications on File Prior to Visit  Medication Sig Dispense Refill   atorvastatin (LIPITOR) 10 MG tablet TAKE 1 TABLET(10 MG) BY MOUTH EVERY EVENING FOR CHOLESTEROL     metFORMIN (GLUCOPHAGE) 500 MG tablet TAKE 1 TABLET(500 MG) BY MOUTH WITH THE EVENING MEAL     triamterene-hydroCHLOROthiazide (MAXZIDE-25) 37.5-25 mg tablet Take 1 tablet by mouth once daily     No current facility-administered medications on file prior to visit.    History reviewed. No pertinent family history.   Social History   Tobacco Use  Smoking Status Never  Smokeless Tobacco Never     Social History   Socioeconomic History   Marital  status: Married  Tobacco Use   Smoking status: Never   Smokeless tobacco: Never  Substance and Sexual Activity   Alcohol use: Not Currently   Drug use: Never    Objective:    Vitals:   01/30/23 1504  BP: (!) 160/80  Pulse: 72  Temp: 36.2 C (97.2 F)  SpO2: 98%  Weight: (!) 124.6 kg (274 lb 12.8 oz)  Height: 175.3 cm (5\' 9" )  PainSc: 0-No pain  PainLoc: Arm    Body mass index is 40.58 kg/m.  Gen: A&Ox3, no distress  Unlabored respirations [place], smooth subcutaneous mass on the right posterior medial surface of the upper arm approximately 6-7 cm  Assessment and Plan:  Diagnoses and all orders for this visit:  Subcutaneous mass    Likely lipoma.  Discussed options including ongoing observation versus excision.  She prefers the latter.  We discussed the procedure and risks of bleeding, infection, pain, scarring, wound healing problems, seroma/hematoma, recurrence of the lesion, undesired cosmetic result.  Questions welcomed and answered to her satisfaction.  We will schedule at her convenience.  Erbie Arment Raquel James, MD

## 2023-03-08 ENCOUNTER — Ambulatory Visit: Payer: BC Managed Care – PPO

## 2023-03-14 ENCOUNTER — Ambulatory Visit
Admission: RE | Admit: 2023-03-14 | Discharge: 2023-03-14 | Disposition: A | Discharge status: Home / Self Care | Payer: BC Managed Care – PPO | Source: Ambulatory Visit | Attending: Family Medicine | Admitting: Family Medicine

## 2023-03-14 DIAGNOSIS — Z1231 Encounter for screening mammogram for malignant neoplasm of breast: Secondary | ICD-10-CM

## 2024-02-18 ENCOUNTER — Other Ambulatory Visit: Payer: Self-pay | Admitting: Family Medicine

## 2024-02-18 DIAGNOSIS — Z1231 Encounter for screening mammogram for malignant neoplasm of breast: Secondary | ICD-10-CM

## 2024-03-31 NOTE — Progress Notes (Addendum)
 [facility] Note   HPI:  This is a 63 y.o. female who presents for follow up for multiple thyroid nodules s/p FNAB x2 with benign results, type 2 diabetes and obesity  Patient was seen for initial consultation for multiple thyroid nodules in May 2024.  She previously had imaging with multiple nodules in the isthmus and left thyroid lobe which were increasing in size.  Fine-needle aspiration biopsy was done in March 2022 with the isthmus nodule consistent with a benign follicular nodule however the left nodule was an insufficient specimen.  No repeat biopsy was obtained.  I repeated her ultrasound in June 2024 which demonstrated slight growth and recommended repeat biopsy or consultation with endocrine surgery. She previously met with the ENT surgeon due to compressive symptoms and was recommended for lobectomy.  In regards to diabetes she was diagnosed with prediabetes initially she is on metformin 500 mg once daily with A1c of 6.9% on metformin.  She eats 2-3 meals a day and 2-3 snacks per day.  Denies any complications from diabetes.  She motivated to lose weight to improve glycemic control.  We started Rybelsus in May 2024 and she had improvement in her A1c as well as significant amount of weight loss.  She is tolerating the medication without side effects  Patient was consented for utilization of ambient recorder DAX Copilot tool for preparation of note for today's encounter. Note reviewed and edited by provider before finalizing.   LV 10/2023  Patient was consented for utilization of ambient recorder DAX Copilot tool for preparation of note for today's encounter. Note reviewed and edited by provider before finalizing.   Interval History 03/31/2024: History of Present Illness The patient is a 63 year old female who presents for follow-up of multiple thyroid nodules status post one benign biopsy and type 2 diabetes. She was last seen in October 2024, at which time her Rybelsus  dosage was increased, and she is here today for a follow-up.  She reports overall good health but has experienced a weight gain of 4 pounds, which she attributes to increased stress levels and potential emotional eating. She expresses uncertainty about the continuation of Rybelsus and wishes to monitor her condition before making a decision. She acknowledges that Rybelsus is not a weight loss medication but believes it, in combination with metformin, has positively impacted her blood sugar levels. She does not regularly test her blood sugar levels and is considering continuous monitoring. She has previously discussed the possibility of thyroid surgery and is contemplating it again, given her current focus on work and mental preparation. She expresses concern about the potential impact of Rybelsus on her thyroid, given her existing nodules. She also mentions cravings for sweets and crunchy foods. She has been engaging in physical activity, specifically walking approximately 2.5 miles, and occasionally suspects hypoglycemia, although she is unsure. She notes that her symptoms improve upon eating.  Wants to start CGM Rybelsus and considering weight management program.   MEDICATIONS Rybelsus, metformin  Patient denies a family history of thyroid issues.  Patient denies personal history of head or neck radiation.   Patient reports compressive symptoms, including swelling of anterior neck, hoarseness of voice but denies trouble swallowing.    Patient denies biotin use  Wt Readings from Last 3 Encounters:  03/31/24 117 kg (257 lb 12.8 oz)  02/17/24 116 kg (256 lb 8 oz)  10/01/23 119 kg (261 lb 12.8 oz)   Review of Systems:  Pertinent positives noted in the HPI  PMH,  PSH, Social Hx, Family Hx, Meds and Allergies   I have independently reviewed the patient's past medical history, past surgical history, medication list, and social history noted below.   Social History  Married.  Employed as  professor. Tobacco Use  . Smoking status: Never    Passive exposure: Never  . Smokeless tobacco: Never  Substance Use Topics  . Alcohol use: No  . Drug use: No   Medications: Current Outpatient Medications  Medication Instructions  . atorvastatin (LIPITOR) 10 mg tablet TAKE 1 TABLET(10 MG) BY MOUTH EVERY EVENING FOR CHOLESTEROL  . blood-glucose sensor (FreeStyle Libre 3 Plus Sensor) Place one sensor every 15 days.  SABRA glucose blood (Precision Q-I-D Test) test strip 1 strip, miscellaneous, Daily  . Lancets misc 1 Stick, miscellaneous, Daily  . metFORMIN (GLUCOPHAGE) 500 mg, oral, Daily with meal  . multivitamin-min-iron-FA-vit K (Multi-Day Plus Minerals) 18 mg iron-400 mcg-25 mcg tab 1 tablet, Daily  . omega-3 360-1,200 mg cap Take  by mouth.  . potassium chloride  (KLOR-CON ) 20 mEq ER tablet TAKE 1 TABLET(20 MEQ) BY MOUTH DAILY  . semaglutide (RYBELSUS) 14 mg, oral, Every 24 hours  . triamterene-hydroCHLOROthiazide (MAXZIDE-25) 37.5-25 mg per tablet TAKE 1 TABLET BY MOUTH DAILY   Allergies: No Known Allergies  Objective Data  Vital Signs: BP 144/80 Comment: recheck  Pulse 98   Ht 1.715 m (5' 7.5)   Wt 117 kg (257 lb 12.8 oz)   SpO2 100%   BMI 39.78 kg/m   Physical Exam: General: Alert, pleasant, well-nourished, no acute distress HEENT:  NCAT, EOMI, vision grossly intact, no proptosis or lid lag  Thyroid: Visible and palpable thyromegaly, nodule palpated moves easy with swallow CV: RRR, normal S1, S2, with no murmurs, rubs or gallops Resp: Normal respiratory effort, lungs clear to auscultation bilaterally Neuro: CN2-12 grossly intact, answers questions appropriately Pysch: Pleasant mood, appropriate affect Extremities: Lower extremities warm, well perfused, no edema noted, no tremors with outstretched hands  Skin: no rashes or wounds noted, no pre-tibial myxedema  Pertinent Labs: TSH  Date Value Ref Range Status  05/16/2023 1.141 0.450 - 5.330 uIU/mL Final   No  components found for: FT4  No components found for: FREET3  No results found for: T3TOTAL  No components found for: TPA  Component     Latest Ref Rng 12/26/2020  TSH     0.450 - 5.330 UIU/ML 0.991    Component     Latest Ref Rng 01/04/2023  Cholesterol, Total     25 - 199 MG/DL 856   Triglycerides     10 - 150 MG/DL 876   Cholesterol, HDL     35 - 135 MG/DL 51   LDL Calculated     0 - 99 MG/DL 67   HX TOTAL CHOL/HDL CHOLESTEROL     <4.5  2.8   Non-HDL Cholesterol     MG/DL 92   HX CORONARY HEART DISEASE RISK TABLE --   Albumin, Urine     MG/L 16   HX U CREATININE CONC     MG/DL 740   HX U ALBUMIN/G CREAT     0 - 30 mg/G CREATININE 6   HX URINE INTERVAL Random   24H Urine Volume     ML 3   HX U PROTEIN CONC     MG/DL 19   Hemoglobin J8r     <5.7 % 6.9 (H)   Hemoglobin A1c     <5.7 % 6.9 (H) (E)  Estimated Average Glucose  MG/DL 848   Estimated Average Glucose     MG/DL 848 (E)  HX EAG COMMENT --   eAG Comment -- (E)  HX VITAMIN B12     200 - 900 pg/mL     Pertinent Imaging: CLINICAL DATA:  Palpable abnormality. Thyromegaly on physical examination. History isthmic nodule fine-needle aspiration performed 03/12/2012   EXAM: THYROID ULTRASOUND   TECHNIQUE: Ultrasound examination of the thyroid gland and adjacent soft tissues was performed.   COMPARISON:  02/26/2012 (report only, no images)   ultrasound-guided isthmic nodule fine-needle aspiration-03/12/2012 (report only, no images)   FINDINGS: Parenchymal Echotexture: Normal   Isthmus: Enlarged measuring 1 cm in diameter   Right lobe: Normal in size measuring 4.9 x 1.8 x 1.2 cm, previously, 4.1 x 1.7 x 1.5 cm   Left lobe: Enlarged measuring 8.2 x 3.3 x 6.9 cm, previously, 5.2 x 2.2 x 1.6 cm   _________________________________________________________   Estimated total number of nodules >/= 1 cm: 1   Number of spongiform nodules >/=  2 cm not described below (TR1): 0   Number of  mixed cystic and solid nodules >/= 1.5 cm not described below (TR2): 0   _________________________________________________________   The previously biopsied 6.6 x 6.3 x 4.1 cm isoechoic mass within the left side of the thyroid isthmus (labeled 1) has increased in size compared to remote ultrasound performed in 2013 previously measuring 2.2 x 2.2 x 1.5 cm (note, report only available for comparison, no images).   _________________________________________________________   Nodule # 2:   Prior biopsy: No   Location: Left; Inferior   Maximum size: 4.6 cm; Other 2 dimensions: 3.0 x 2.8 cm, previously, 2.7 x 1.7 x 1.5 cm   Composition: solid/almost completely solid (2)   Echogenicity: isoechoic (1)   Shape: not taller-than-wide (0)   Margins: ill-defined (0)   Echogenic foci: none (0)   ACR TI-RADS total points: 3.   ACR TI-RADS risk category:  TR3 (3 points).   Significant change in size (>/= 20% in two dimensions and minimal increase of 2 mm): Yes   Change in features: No   Change in ACR TI-RADS risk category: No   ACR TI-RADS recommendations:   Given size (>/= 2.5 cm) and appearance, fine needle aspiration of this mildly suspicious nodule should be considered based on TI-RADS criteria.   _________________________________________________________   IMPRESSION: Both previously described isthmic and left-sided thyroid nodules have significantly increased in size compared to remote examination performed in 2013 (note, report only available for comparison, no images). As such, both nodules could undergo ultrasound-guided fine-needle aspiration as indicated.  Assessment  Sydney Mcclain is a 63 y.o. female with a past medical history of above who presents for:  1. Type 2 diabetes mellitus with hyperglycemia, without long-term current use of insulin (CMD)  POC Hemoglobin A1c   POC Glucose (Hemocue/NOVA)   blood-glucose sensor (FreeStyle Libre 3 Plus Sensor)    Ambulatory referral to Diabetes Education and Medical Nutrition Therapy   metFORMIN (GLUCOPHAGE) 500 mg tablet   semaglutide (Rybelsus) 14 mg tab tablet    2. Multiple thyroid nodules  US  Thyroid   CANCELED: US  Thyroid    3. Class 3 severe obesity with serious comorbidity and body mass index (BMI) of 40.0 to 44.9 in adult, unspecified obesity type       Plan   1.  Multiple thyroid nodules status post FNA B x 2 (isthmus benign, left insufficient specimen) A: Patient reports that she had biopsies of multiple  thyroid nodules and isthmus and left nodule that were benign remotely and most recently left nodule was nondiagnostic.  We discussed that based on the size of nodules greater than 4 cm there is a high risk of sampling error and the possibility of missing a small malignant focus.  I discussed with her that repeat biopsy is indicated of her left nodule.    The ultrasound results were reviewed, indicating a spongiform pattern which is typically benign, but the nodule is enlarging. A consultation with a surgeon was recommended to discuss the procedure and recovery process. She was advised to gather more information before deciding on the best course of action. A referral to an endocrine surgeon or an ENT surgeon was suggested. She will send the provider the name of a surgeon recommended by a colleague. She has since deferred this. She is thinking about proceeding with surgery after upcoming retirement.   She is reporting some compressive symptoms and is considering proceeding with surgery and would like to possibly discuss this with the surgeon she was recommended by a friend. If not proceeding with surgery, I recommend FNAB.   P: Labs: TSH annually   Imaging: Thyroid ultrasound due 05/2024 or earlier if not proceeding with FNAB/surgery - ordered repeat imaging today   Referral: Endocrine surgery to discuss possible surgical options given enlarging low risk thyroid nodule growth  pattern.  Education/Counseling: Biotin lab interference guidelines in AVS  2.  Type 2 diabetes, obesity BMI 42 --> 39 A/P: Patient has well-controlled diabetes on metformin and Rybelsus.  She denies any complications.  Her A1c level is excellent at 5.9. She has experienced a >20-pound weight loss since starting Rybelsus 7 mg. Will continue current dose. Libre 3 CGM prescribed and CDE referral placed.   Return in about 6 months (around 09/30/2024) for thyroid nodule(s), DMT2.

## 2024-06-05 NOTE — Telephone Encounter (Signed)
 Pt called wanting to know if the referral for thyroid ultrasound is still active. Pt advise is referral still active. Pt informed to contact [facility] to schedule an appt.  Lauraine NOVAK CMA

## 2024-06-05 NOTE — Telephone Encounter (Signed)
 Patient was transferred to the nurse.

## 2024-06-11 NOTE — Progress Notes (Signed)
 [address] - AMBULATORY [facility]  - [facility] [address] [place] [address]    Date of service:  06/11/2024  Name:  Sydney Mcclain  Date of Birth:  1961/11/19    SUBJECTIVE   Patient ID: Sydney Mcclain is a 63 y.o. (DOB 1961-12-20) female  Chief Complaint  Patient presents with  . Follow-up  . Hypertension    Last visit:  02/17/24.  Right Foot Issue Duration: noticed today Location: ball of right foot Symptoms: slight discomfort Treatment: none. Denies any injuries.  Anxiety:  Patient comes in for anxiety follow up.  Last visit initial & she is gradually worsening.  Symptoms characterized by:  worrying a lot, worrying about different things, feeling anxious.  Sx started about 6 weeks ago, gradually worsening since that time.  Patient is currently not taking any medication.  She denies current suicidal and homicidal ideation.  Family history significant for heart disease.  Previous treatment includes none and none.   She complains of the following side effects from the treatment: none.  Most recent GAD-7 result: GAD-7 Total Score: 7 (06/11/24 1057)   Hypertension:  Pt is taking medications as instructed but not around the same time, no medication side effects noted.  Home blood pressure monitoring:  No BP elevated readings 174/106, 187/101, 179/103 (from dentist appt manual/electric, 06/11/24) Exercise:  No. Pt needs a bp monitor, hers is broken. Recent behavioral changes. Some anxiety, retiring soon.   ROS negative for exertional chest pain, ankle edema, palpitations, or decrease in exercise tolerance.  BP Readings from Last 3 Encounters:  06/11/24 148/90  03/31/24 144/80  02/17/24 134/80                        Wt Readings from Last 3 Encounters:  06/11/24 119 kg (261 lb 6 oz)  03/31/24 117 kg (257 lb 12.8 oz)  02/17/24 116 kg (256 lb 8 oz)    Lab Results  Component Value  Date   CHOL 149 03/09/2024   CHOL 143 01/04/2023   CHOL 149 12/28/2021   Lab Results  Component Value Date   HDL 50 (L) 03/09/2024   HDL 51 01/04/2023   HDL 54 12/28/2021   Lab Results  Component Value Date   LDLCALC 77 03/09/2024   Lab Results  Component Value Date   TRIG 141 03/09/2024   TRIG 123 01/04/2023   TRIG 116 12/28/2021   No results found for: CHOLHDL  Hypertension treatment goals reviewed with the patient.  Review of Systems  All other pertinent systems reviewed and are negative.   Social History[1]   The following portions of the patient's history were reviewed and updated as appropriate: allergies, current medications, past family history, past medical history, past social history, past surgical history and problem list.   OBJECTIVE   Vitals:   06/11/24 1059 06/11/24 1114  BP: (!) 160/100 148/90  BP Location: Left arm Left arm  Patient Position: Sitting Sitting  Pulse: 97   Temp: 97.9 F (36.6 C)   TempSrc: Temporal   SpO2: 99%   Weight: 119 kg (261 lb 6 oz)   Height: 1.715 m (5' 7.5)     Body mass index is 40.33 kg/m.  Constitutional: Alert - NAD. Appears well-developed and well nourished. Cardiovascular: Normal rate and rhythm.  No MGR.  No pedal edema. Pulmonary/chest: Effort normal and breath sounds normal. No wheezes. No  rales.   ASSESSMENT/PLAN   Problem List Items Addressed This Visit     Hypertension, benign - Primary   Blood pressure is not at goal, she is currently on Maxide, discussed adding amlodipine-benazepril 2.5-10 mg daily, however we will have her monitor at home and do a nurse appointment in 3 to 4 weeks as she feels a lot of this is related to anxiety which has intensified as her official retirement approaches.      Other Visit Diagnoses       Anxiety       Relevant Orders   Ambulatory referral to Behavioral Health     Tends to be a chronic worrier, anxious, it was better controlled when she was working  because her attention was always on work.  Referral for therapy. Risks, benefits, and alternatives of the medication(s) and treatment plan(s) were discussed, and she expressed understanding.  No barriers to treatment identified in this visit.   Return for next scheduled office visit, nurse visit 3-4 weeks BP check.   Current Outpatient Medications  Medication Instructions  . atorvastatin (LIPITOR) 10 mg tablet TAKE 1 TABLET(10 MG) BY MOUTH EVERY EVENING FOR CHOLESTEROL  . blood-glucose sensor (FreeStyle Libre 3 Plus Sensor) Place one sensor every 15 days.  SABRA glucose blood (Precision Q-I-D Test) test strip 1 strip, miscellaneous, Daily  . Lancets misc 1 Stick, miscellaneous, Daily  . metFORMIN (GLUCOPHAGE) 500 mg, oral, Daily with meal  . multivitamin-min-iron-FA-vit K (Multi-Day Plus Minerals) 18 mg iron-400 mcg-25 mcg tab 1 tablet, Daily  . omega-3 360-1,200 mg cap Take  by mouth.  . potassium chloride  20 mEq ER tablet 20 mEq, oral, Daily  . semaglutide (RYBELSUS) 14 mg, oral, Every 24 hours  . triamterene-hydroCHLOROthiazide (MAXZIDE-25) 37.5-25 mg per tablet TAKE 1 TABLET BY MOUTH DAILY       This document serves as a record of services personally performed by Madelin Brought, MD.  It was created on their behalf by Rolin LOISE Ka, CMA, a trained medical scribe, and Certified Medical Assistant (CMA). During the course of documenting the history, physical exam and medical decision making, I was functioning as a Stage manager. The creation of this record is the provider's dictation and/or activities during the visit.  Electronically signed by Rolin LOISE Ka, CMA 06/11/2024 8:48 AM    I agree the documentation is accurate and complete.  Electronically signed by: Rolin LOISE Ka, CMA 06/11/2024 8:48 AM         [1] Social History Tobacco Use  . Smoking status: Never    Passive exposure: Never  . Smokeless tobacco: Never  Substance Use Topics  . Alcohol use: No  . Drug use: No

## 2024-06-24 ENCOUNTER — Ambulatory Visit: Payer: Self-pay

## 2024-06-24 ENCOUNTER — Ambulatory Visit
Admission: RE | Admit: 2024-06-24 | Discharge: 2024-06-24 | Discharge status: Home / Self Care | Payer: Self-pay | Source: Ambulatory Visit | Attending: Family Medicine

## 2024-06-24 DIAGNOSIS — Z1231 Encounter for screening mammogram for malignant neoplasm of breast: Secondary | ICD-10-CM

## 2024-07-23 NOTE — Care Plan (Signed)
  Problem: Other resource needs Goal: Community resources provided Outcome: Progressing Note: CHW made initial contact with patient regarding Rhythm B Study. Patient has no need for community resources at this time. CHW will check back in bi-weekly for blood pressure check.  Intervention: Assess the need for other community resources

## 2024-07-31 NOTE — Progress Notes (Signed)
 Encounter Diagnosis  Name Primary?  . Controlled type 2 diabetes mellitus without complication, without long-term current use of insulin (HCC) Yes   DIABETES SELF MANAGEMENT EDUCATION This visit was conducted as a: Visit Type: Office visit This visit was conducted as a: Appointment Type: an individual appointment. individual Appts: No group available within needed timeframe.  Time in: 1500 Time out:1600  Subjective: Sydney Mcclain presents for an initial Diabetes Education Visit.  Patient stated learning needs: nutritional management.  DIABETES MEDICATION:  Currently on GLP1 with good  control.  Patient is compliant with treatment.    Current Outpatient Medications  Medication Instructions  . atorvastatin (LIPITOR) 10 mg tablet TAKE 1 TABLET(10 MG) BY MOUTH EVERY EVENING FOR CHOLESTEROL  . blood-glucose sensor (FREESTYLE LIBRE 3 PLUS SENSOR MISC) miscellaneous  . cholecalciferol (VITAMIN D3) 1,000 Units, oral, Daily  . cyanocobalamin (VITAMIN B12) 1,000 mcg, oral, Daily  . glucose blood (Precision Q-I-D Test) test strip 1 strip, miscellaneous, Daily  . Lancets misc 1 Stick, miscellaneous, Daily  . multivitamin-min-iron-FA-vit K (Multi-Day Plus Minerals) 18 mg iron-400 mcg-25 mcg tab 1 tablet, Daily  . potassium chloride  20 mEq ER tablet 20 mEq, oral, Daily  . semaglutide (RYBELSUS) 14 mg, oral, Every 24 hours  . triamterene-hydroCHLOROthiazide (MAXZIDE-25) 37.5-25 mg per tablet 1 tablet, oral, Daily   MONITORING:  Patient did bring Meter: meter and records into the clinic today.   Glucose Information: CGM: using libre CGM Lab Results  Component Value Date   HGBA1C 6.7 (H) 05/16/2023   HGBA1C 6.9 (H) 01/04/2023   HGBA1C 6.9 (H) 01/04/2023   HGBA1C 6.4 (H) 06/29/2022     NUTRITION MANAGEMENT:   Food Recall: patient consuming very low carb meal plan.  BEVERAGES CONSUMED: Other all sugar free beverages.  There were no vitals taken for this visit. Wt Readings from Last 3  Encounters:  07/21/24 117 kg (257 lb 12.8 oz)  06/11/24 119 kg (261 lb 6 oz)  03/31/24 117 kg (257 lb 12.8 oz)    PHYSICAL ACTIVITY: The patient does not participate in regular exercise at present.   Education:     - DM dx/disease process and management (monitoring, medications, diet, and physical activity), micro/macrovascular complications of uncontrolled DM reviewed.  - Hypo/hyperglycemia s/s, tx, rule of 15 - Importance of monitoring BG and target ranges for fasting, pre/post prandial, strategies to improve A1c and target (7% or less without hypoglycemia) unless otherwise specified by MD. - Sources of carbohydrates, portion sizes, amount of grams/serving. - portion sizes demonstrated using food models. - Food label reading - locate serving size and total carbohydrate.  - plate method for meal planning, macronutrient balance at meals and snacks.   - Effects of carbohydrates, protein, fat, and fiber on BG's - Meal plan 30 grams carbohydrate at meals and up to 15 grams carbohydrate at snacks. - Benefits of PA for management of DM and improving A1c.  PA goal - 150 minutes weekly or per MD instructions. - reviewed DM schedule of care (dilated eye exam, foot exam, dental exam biannually, complete physical, lipids, urine albumin, kidney fxn checked annually)  Resources: -  Balanced plate -  understanding your A1c  -  Managing your Diabetes booklet.    Plan of care:  consume macronutrient balanced meals, increase carb consumption at meals.  Patient understanding: Sydney Mcclain verbalized understanding of education provided and plan of care. Expected adherence:  good Reinforcement needed:  yes, patient will need continued support to meet behavior change goals.  Follow up: prn for  BG review and reassessment of behavior change goals.  Referring provider has access to education plan/topics discussed and outcomes through shared EMR.

## 2024-08-03 NOTE — Care Plan (Signed)
  Problem: Other resource needs Goal: Community resources provided Outcome: Progressing Note: CHW spoke with patient who requesting some information on Mindfulness and asked CHW to send over through email. CHW sent patient a list of resources the email on file. CHW provided information on the Rhythm B Study enrollment process. Patient and CHW discussed eating habits, exercise, portion control  and her pcp referred her to a diabetic nutritionist.  Patient had no other needs for community resources at this time.  Patient aware of taking blood pressure 2x daily.

## 2024-08-10 NOTE — Progress Notes (Signed)
 Clinical Pharmacist Visit Note - RHYTHM-BP Study (HTN)  Sydney Mcclain is a 63 y.o. female contacted via telephone for a follow up visit.   Program:  RHYTHM-BP Study (Remote Hypertension Tracking, Help and Management to improve Blood Pressure) PCP:  Sydney Rachel Brought, MD  RHYTHM-BP enrollment date: 07/21/24 RHYTHM-BP active phase dates: 07/21/24 to 01/20/25 RHYTHM-BP maintenance phase dates: 01/21/25 to 07/20/25 Study Coordinator: Sydney Mcclain CHW: Sydney Mcclain  ASSESSMENT & PLAN   Hypertension Patient's blood pressure is borderline controlled per [facility] report.  BP Goal:  < 130/80 based on 2017 ACC/AHA guidelines   Current 14-day average:  135/73 mmHg 30-day average:  133/74 mmHg 90-day average:  N/A  Current medication regimen listed below.  Today, patient confirms taking medications as prescribed and denies missed doses. Patient denies adverse effects. Sydney Mcclain reports she had a fun weekend in [place]! She was able to primarily stick to her routine while traveling, although maybe ate a bit more sodium than normal. Her BP was downtrending throughout the weekend and looks great this morning at 122/79 mmHg. She denies symptoms of hypertensive emergency over the weekend. Patient continues to inquire about reasons for higher BP readings last week. Discontinuing metformin last week may have played a role, as well as likely some component of anxiety surrounding higher readings. However, patient states her blood glucose has much improved since stopping the metformin. Pleased that BP has normalized today, and anticipate that readings will level off and stabilize. She sees her PCP next week. Patient continues to work on increasing fitness routine by walking further distances.  PLAN No medication changes today as BP has downtrended over the past few days and is back at goal this morning. Will monitor closely for stabilization of readings over the next few weeks. If BP does not stabilize, will  plan to reach out to endocrinologist about thoughts on restarting metformin vs adding another antihypertensive. Continue triamterene-HCTZ 37.5-25 mg daily. Encouraged patient to continue increasing exercise with walking and looking for exercise classes. Check blood pressure 2 time(s) per day.  Patient verbalized understanding and agreement with plan.  Patient was advised to call back with any questions, concerns, or changes in symptoms.  Next steps: Continue to monitor BP closely, may need additional agent such as ACE/ARB vs resuming metformin  Follow-up The patient will continue to follow up with the pharmacist.  Next encounter with pharmacist is planned in 2 week(s) via phone visit (08/24/24, 10am).  SUBJECTIVE & OBJECTIVE   Hypertension  HPI Current hypertension medication regimen: triamterene-hydroCHLOROthiazide - 37.5-25 mg daily (last fill 06/30/24, 90 ds)  BP Medication History to Date   1. ACEi: N/A 2. ARB: N/A 3. Thiazide: triamterene/hctz - current medication 4. CCB: N/A 5. BB: N/A 6. K sparing diuretic: N/A 7: Alpha-1 blocker: N/A 8. Clonidine: N/A 9. Vasodilators: N/A 10: Other: N/A   Baseline BP meds:   Triamterene/hydrochlorothiazide 37.5/25 mg daily    BP Medication changes to date: None   Lifestyle Physical activity/exercise:  working on establishing a new routine, increasing distance of where she parks to where she is going, flexibility, next plan is to start walking routinely to build up to 3 miles - previously was at 4 times a week; recently retired thinking to join exercise class once a week. Diet: relatively healthy but portion control is a concern. New routine of late working on watch salt and process food intake. Reactive to sodium in summer months < 1500 mg daily, maintaining appropriate hydration.  Adds salt to  food: no - not at the table and minimal while cooking Caffeine consumption:  yes.  Amount:  very little and almost never in the summer   Elevated stress:  6/10 hard to describe in a transitional state leaving stressful work to retirement and the life transition Alcohol use:  no.  Amount:  rarely Tobacco use:   reports that she has never smoked. She has never been exposed to tobacco smoke. She has never used smokeless tobacco. Recreational substance use:  no.    Monitoring Cuff type:  Smart Meter iBloodPressure Classic  Interface:  Carium Appropriate technique:  yes   Symptom Review Hypotension:  no.  Dizziness with low readings:  no. Chest pain:  no Shortness of breath:  no Lower extremity edema:  no Systolic > 180 or Diastolic > 899 assessment:  N/A   Comorbidities ASCVD condition(s): Yes HF:  no  CKD:  no Diabetes:  yes Dyslipidemia:  yes Overweight/Obesity:  yes OSA:  unknown  CPAP:  No   Health Maintenance Aspirin utilization:  Patient is not taking aspirin due to the following reason(s): not indicated. Statin utilization:  Patient is taking a statin, atorvastatin 10 mg.  [facility] Report      Vital Signs and Laboratory Values  BP Readings from Last 3 Encounters:  07/21/24 125/82  07/13/24 138/82  06/11/24 148/90    Pulse Readings from Last 3 Encounters:  07/21/24 101  06/11/24 97  03/31/24 98    Wt Readings from Last 2 Encounters:  07/21/24 117 kg (257 lb 12.8 oz)  06/11/24 119 kg (261 lb 6 oz)    BMI Readings from Last 2 Encounters:  07/21/24 39.78 kg/m  06/11/24 40.33 kg/m    Lab Results  Component Value Date   HGBA1C 6.7 (H) 05/16/2023   POCA1C 6.0 (A) 07/21/2024   CREATININE 0.93 03/09/2024   EGFR 69 03/09/2024   K 3.6 03/09/2024   NA 141 03/09/2024   ALT 8 03/09/2024   AST 9 (L) 03/09/2024   CHOL 149 03/09/2024   LDLCALC 77 03/09/2024   HDL 50 (L) 03/09/2024   TRIG 141 03/09/2024   HCT 36.1 03/09/2024   HGB 11.8 (L) 03/09/2024    The 10-year ASCVD risk score (Arnett DK, et al., 2019) is: 12.5%   Values used to calculate the score:     Age: 57 years      Clincally relevant sex: Female     Is Non-Hispanic African American: Yes     Diabetic: Yes     Tobacco smoker: No     Systolic Blood Pressure: 125 mmHg     Is BP treated: No     HDL Cholesterol: 50 mg/dL     Total Cholesterol: 149 mg/dL   Medication History  Medication history was completed. Patient's medication list was unchanged today  Allergies:   Allergies as of 08/10/2024  . (No Known Allergies)     Adherence/Access Refill and adherence history were reviewed with the patient.  The patient was educated on the importance of adherence.   Patient is compliant with prescribed medications. Reported frequency of missed doses:rarely Barriers to adherence: Forgets to take   Primary prescription drug coverage:  [facility] Plan [facility] Refills needed:  no.   Adverse Drug Reactions Adverse drug reactions were reviewed with the patient.   Side effects are not reported.  Current Outpatient Medications  Medication Instructions  . atorvastatin (LIPITOR) 10 mg tablet TAKE 1 TABLET(10 MG) BY MOUTH EVERY EVENING  FOR CHOLESTEROL  . blood-glucose sensor (FREESTYLE LIBRE 3 PLUS SENSOR MISC) miscellaneous  . cholecalciferol (VITAMIN D3) 1,000 Units, oral, Daily  . cyanocobalamin (VITAMIN B12) 1,000 mcg, oral, Daily  . glucose blood (Precision Q-I-D Test) test strip 1 strip, miscellaneous, Daily  . Lancets misc 1 Stick, miscellaneous, Daily  . multivitamin-min-iron-FA-vit K (Multi-Day Plus Minerals) 18 mg iron-400 mcg-25 mcg tab 1 tablet, Daily  . potassium chloride  20 mEq ER tablet 20 mEq, oral, Daily  . semaglutide (RYBELSUS) 14 mg, oral, Every 24 hours  . triamterene-hydroCHLOROthiazide (MAXZIDE-25) 37.5-25 mg per tablet 1 tablet, oral, Daily     Education  Education provided:  BP goal and disease risk factors, encouraged patient to monitor BP at home, importance of medication adherence, lifestyle modifications, and increase physical activity  Time  spent with patient: 15 minutes.  Leeroy Phlegm, PharmD, BCPS Clinical Pharmacist - Population Health and Acute Care Medicine

## 2024-08-17 NOTE — Progress Notes (Signed)
 [address] - AMBULATORY [facility]  - FAMILY MEDICINE [facility] [address] [place] [address]    Date of service:  08/17/2024  Name:  Sydney Mcclain  Date of Birth:  [birth year]   SUBJECTIVE   Patient ID: Sydney Mcclain is a 63 y.o. (DOB [birth year]) female  Chief Complaint  Patient presents with  . Hypertension  . Hyperlipidemia    Last visit:  06/11/24.  Hypertension  Pt is taking medications as instructed, no medication side effects noted.  Home blood pressure monitoring:  Yes, 131/77, 127/73. Pt states she is enrolled in our BP monitor study. Exercise:  No, but has changed diet and incorporating exercise regimens. Like parking further away from entrance. ROS negative for exertional chest pain, ankle edema, palpitations, or decrease in exercise tolerance.  Diabetes Controlled, Endo following Would like urine test done today  Hyperlipidemia  Current treatment  Atorvastatin 10 mg. Not experiencing side effects to the medication.   Treatment goals reviewed.   BP Readings from Last 3 Encounters:  08/17/24 124/76  07/21/24 125/82  07/13/24 138/82                        Wt Readings from Last 3 Encounters:  08/17/24 115 kg (253 lb)  07/21/24 117 kg (257 lb 12.8 oz)  06/11/24 119 kg (261 lb 6 oz)      Lab Results  Component Value Date   CHOL 149 03/09/2024   CHOL 143 01/04/2023   CHOL 149 12/28/2021   Lab Results  Component Value Date   HDL 50 (L) 03/09/2024   HDL 51 01/04/2023   HDL 54 12/28/2021   Lab Results  Component Value Date   LDLCALC 77 03/09/2024   Lab Results  Component Value Date   TRIG 141 03/09/2024   TRIG 123 01/04/2023   TRIG 116 12/28/2021   No results found for: CHOLHDL   Review of Systems -  All other pertinent systems reviewed and are negative.   Social History[1]  The following portions of the patient's history were reviewed and updated as appropriate:  allergies, current medications, past family history, past medical history, past social history, past surgical history and problem list.  OBJECTIVE   Vitals:   08/17/24 0955  BP: 124/76  Pulse: 103  Resp: 16  Temp: 97.3 F (36.3 C)  TempSrc: Temporal  SpO2: 97%  Weight: 115 kg (253 lb)  Height: 1.715 m (5' 7.5)    Body mass index is 39.04 kg/m.  Constitutional: Alert - NAD. Appears well-developed and well nourished. Cardiovascular: Normal rate and rhythm.  Exam reveals no gallop and no friction rub.  No murmur heard.  Pulmonary/chest: Effort normal and breath sounds normal. No wheezes. No rales.  Extremities: No edema bilaterally.   ASSESSMENT/PLAN   Problem List Items Addressed This Visit     Hyperlipidemia LDL goal <100 - Primary   Lipids in desired range Continue current management.       Hypertension, benign   Controlled, BP at goal. Continue current management.       Controlled type 2 diabetes mellitus without complication, without long-term current use of insulin (HCC)   Relevant Orders   Albumin, Random Urine   Risks, benefits, and alternatives of the medication(s) and treatment plan(s) were discussed, and she expressed understanding. Plan follow up as discussed or as needed. No barriers to treatment identified in this visit.  Return for next scheduled office visit, as needed.  Current Outpatient Medications  Medication Instructions  . atorvastatin (LIPITOR) 10 mg tablet TAKE 1 TABLET(10 MG) BY MOUTH EVERY EVENING FOR CHOLESTEROL  . cholecalciferol (VITAMIN D3) 1,000 Units, Daily  . cyanocobalamin (VITAMIN B12) 1,000 mcg, Daily  . multivitamin-min-iron-FA-vit K (Multi-Day Plus Minerals) 18 mg iron-400 mcg-25 mcg tab 1 tablet, Daily  . potassium chloride  20 mEq ER tablet 20 mEq, oral, Daily  . semaglutide (RYBELSUS) 14 mg, oral, Every 24 hours  . triamterene-hydroCHLOROthiazide (MAXZIDE-25) 37.5-25 mg per tablet 1 tablet, oral, Daily    This document serves  as a record of services personally performed by Madelin Brought, MD.  It was created on their behalf by Rolin LOISE Ka, CMA, a trained medical scribe, and Certified Medical Assistant (CMA). During the course of documenting the history, physical exam and medical decision making, I was functioning as a Stage manager. The creation of this record is the provider's dictation and/or activities during the visit.  Electronically signed by Rolin LOISE Ka, CMA 08/17/2024 7:57 AM  This document serves as a record of services personally performed by Madelin Brought, MD.  It was created on their behalf by Gwenlyn Cable, CMA, a trained medical scribe, and Certified Medical Assistant (CMA). During the course of documenting the history, physical exam and medical decision making, I was functioning as a Stage manager. The creation of this record is the provider's dictation and/or activities during the visit.   Electronically signed by: Gwenlyn Cable, CMA 08/17/2024 9:55 AM   I agree the documentation is accurate and complete.  Electronically signed by: Madelin Rachel Brought, MD 08/17/2024 10:21 AM         [1] Social History Tobacco Use  . Smoking status: Never    Passive exposure: Never  . Smokeless tobacco: Never  Substance Use Topics  . Alcohol use: No  . Drug use: No

## 2024-08-20 NOTE — Care Plan (Signed)
  Problem: Other resource needs Goal: Community resources provided Outcome: Progressing Note:  CHW made bi-weekly Rhythm B Study blood pressure contact with patient. Things are going well and patient is happy to be participating in the study. Patient thanked CHW for providing resources for mindfulness.  CHW will follow-up in 2 weeks.

## 2024-08-24 NOTE — Progress Notes (Signed)
 Clinical Pharmacist Visit Note - RHYTHM-BP Study (HTN)  Sydney Mcclain is a 63 y.o. female contacted via telephone for a follow up visit.   Program:  RHYTHM-BP Study (Remote Hypertension Tracking, Help and Management to improve Blood Pressure) PCP:  Madelin Rachel Brought, MD  RHYTHM-BP enrollment date: 07/21/24 RHYTHM-BP active phase dates: 07/21/24 to 01/20/25 RHYTHM-BP maintenance phase dates: 01/21/25 to 07/20/25 Study Coordinator: Sonny Ege CHW: Calton Aran  ASSESSMENT & PLAN   Hypertension Patient's blood pressure is controlled per [facility] report.  BP Goal:  < 130/80 based on 2017 ACC/AHA guidelines   Current 14-day average:  124/71 mmHg 30-day average:  130/72 mmHg 90-day average:  N/A  Current medication regimen listed below.  Today, patient confirms taking medications as prescribed and denies missed doses. Patient denies adverse effects. Sydney Mcclain reports doing well today. She shares that her blood sugar has stabilized since coming off of metformin, and low blood sugar is infrequent. She feels like her blood pressure has stabilized too. We discuss blood pressure numbers, and she is at goal over the last 14-day average! Sydney Mcclain is excited to hear this news. She tells me she does well checking her blood pressure but is unable to check it at the same time each day. Encouraged her the timing does not matter and to check as her day-to-day allows. Since BP is at goal, frequency can decrease to once daily checks.  Since the previous appointment, she tells me that she has met with a nutritionist about her diabetes, and she is incorporating those changes. Sydney Mcclain also mentions she is starting to walk a little more now that the weather is not over 90 degrees. She also is parking at the back of parking lots to increase number of steps when out running errands.  Sydney Mcclain shows a great deal of self-awareness with her health, and she tells me she notices changes in her blood pressure if  she has more salt than normal or experiencing more stress. Lastly, she asks about vitamin d supplementation. She voices concerns if it is safe to take with her blood pressure medications. Instructed patient she can safely take vitamin d with her blood pressure medications.    PLAN No medication changes BP is at goal. Continue triamterene-HCTZ 37.5-25 mg daily. Encouraged patient to continue increasing exercise with walking and looking for exercise classes. Check blood pressure 1 time(s) per day.  Patient verbalized understanding and agreement with plan.  Patient was advised to call back with any questions, concerns, or changes in symptoms.  Next steps: Continue to monitor BP closely, may need additional agent such as ACE/ARB vs resuming metformin  Follow-up The patient will continue to follow up with the pharmacist.  Next encounter with pharmacist is planned in 4 week(s) via phone visit (09/21/24, 10:00 am).  SUBJECTIVE & OBJECTIVE   Hypertension  HPI Current hypertension medication regimen: triamterene-hydroCHLOROthiazide - 37.5-25 mg daily (last fill 06/30/24, 90 ds)  BP Medication History to Date   1. ACEi: N/A 2. ARB: N/A 3. Thiazide: triamterene/hctz - current medication 4. CCB: N/A 5. BB: N/A 6. K sparing diuretic: N/A 7: Alpha-1 blocker: N/A 8. Clonidine: N/A 9. Vasodilators: N/A 10: Other: N/A   Baseline BP meds:   Triamterene/hydrochlorothiazide 37.5/25 mg daily    BP Medication changes to date: None   Lifestyle Physical activity/exercise:  working on establishing a new routine, increasing distance of where she parks to where she is going, flexibility, next plan is to start walking routinely to  build up to 3 miles - previously was at 4 times a week; recently retired thinking to join exercise class once a week. Diet: relatively healthy but portion control is a concern. New routine of late working on watch salt and process food intake. Reactive to sodium in summer  months < 1500 mg daily, maintaining appropriate hydration.  Adds salt to food: no - not at the table and minimal while cooking Caffeine consumption:  yes.  Amount:  very little and almost never in the summer  Elevated stress:  6/10 hard to describe in a transitional state leaving stressful work to retirement and the life transition Alcohol use:  no.  Amount:  rarely Tobacco use:   reports that she has never smoked. She has never been exposed to tobacco smoke. She has never used smokeless tobacco. Recreational substance use:  no.    Monitoring Cuff type:  Smart Meter iBloodPressure Classic  Interface:  Carium Appropriate technique:  yes   Symptom Review Hypotension:  no.  Dizziness with low readings:  no. Chest pain:  no Shortness of breath:  no Lower extremity edema:  no Systolic > 180 or Diastolic > 899 assessment:  N/A   Comorbidities ASCVD condition(s): Yes HF:  no  CKD:  no Diabetes:  yes Dyslipidemia:  yes Overweight/Obesity:  yes OSA:  unknown  CPAP:  No   Health Maintenance Aspirin utilization:  Patient is not taking aspirin due to the following reason(s): not indicated. Statin utilization:  Patient is taking a statin, atorvastatin 10 mg.  Carium Report    Vital Signs and Laboratory Values  BP Readings from Last 3 Encounters:  08/17/24 124/76  07/21/24 125/82  07/13/24 138/82    Pulse Readings from Last 3 Encounters:  08/17/24 103  07/21/24 101  06/11/24 97    Wt Readings from Last 2 Encounters:  08/17/24 115 kg (253 lb)  07/21/24 117 kg (257 lb 12.8 oz)    BMI Readings from Last 2 Encounters:  08/17/24 39.04 kg/m  07/21/24 39.78 kg/m    Lab Results  Component Value Date   HGBA1C 6.7 (H) 05/16/2023   POCA1C 6.0 (A) 07/21/2024   CREATININE 0.93 03/09/2024   EGFR 69 03/09/2024   ALBCRERAT 5 08/17/2024   K 3.6 03/09/2024   NA 141 03/09/2024   ALT 8 03/09/2024   AST 9 (L) 03/09/2024   CHOL 149 03/09/2024   LDLCALC 77 03/09/2024   HDL 50  (L) 03/09/2024   TRIG 141 03/09/2024   HCT 36.1 03/09/2024   HGB 11.8 (L) 03/09/2024    The 10-year ASCVD risk score (Arnett DK, et al., 2019) is: 12.3%   Values used to calculate the score:     Age: 55 years     Clincally relevant sex: Female     Is Non-Hispanic African American: Yes     Diabetic: Yes     Tobacco smoker: No     Systolic Blood Pressure: 124 mmHg     Is BP treated: No     HDL Cholesterol: 50 mg/dL     Total Cholesterol: 149 mg/dL   Medication History  Medication history was completed. Patient's medication list was unchanged today  Allergies:   Allergies as of 08/24/2024  . (No Known Allergies)     Adherence/Access Refill and adherence history were reviewed with the patient.  The patient was educated on the importance of adherence.   Patient is compliant with prescribed medications. Reported frequency of missed doses:rarely Barriers to adherence: Forgets to  take   Primary prescription drug coverage:  [facility] Plan [facility] Refills needed:  no.   Adverse Drug Reactions Adverse drug reactions were reviewed with the patient.   Side effects are not reported.  Current Outpatient Medications  Medication Instructions  . atorvastatin (LIPITOR) 10 mg tablet TAKE 1 TABLET(10 MG) BY MOUTH EVERY EVENING FOR CHOLESTEROL  . cholecalciferol (VITAMIN D3) 1,000 Units, Daily  . cyanocobalamin (VITAMIN B12) 1,000 mcg, Daily  . multivitamin-min-iron-FA-vit K (Multi-Day Plus Minerals) 18 mg iron-400 mcg-25 mcg tab 1 tablet, Daily  . potassium chloride  20 mEq ER tablet 20 mEq, oral, Daily  . semaglutide (RYBELSUS) 14 mg, oral, Every 24 hours  . triamterene-hydroCHLOROthiazide (MAXZIDE-25) 37.5-25 mg per tablet 1 tablet, oral, Daily     Education  Education provided:  BP goal and disease risk factors, encouraged patient to monitor BP at home, importance of medication adherence, lifestyle modifications, and increase physical  activity  Time spent with patient: 15 minutes.  Bernardino London, PharmD, BCPS Clinical Pharmacist Specialist Adult Acute Care/Population Health 08/24/2024 10:35 AM

## 2024-08-24 NOTE — Telephone Encounter (Signed)
 Medical record collection - see flowsheet

## 2024-09-03 NOTE — Care Plan (Signed)
  Problem: Other resource needs Goal: Community resources provided Outcome: Progressing Note:  CHW made contact for bi-weekly Rhythm B blood pressure study with patient. Patient doesn't seem to have any issues at this time. Patient currently out of town for fathers birthday celebration.

## 2024-09-10 NOTE — Progress Notes (Signed)
 Endocrine Surgery Consultation  Sydney Mcclain is a 63 y.o. female who is being seen in consultation at the request of Dr. Goukassian, Carlos Lenis*  for the evaluation of thyroid nodules.  She underwent ultrasound recently which showed left thyroid nodules and a possible abnormal right cervical lymph node.  She feels that she can see the nodules. Denies trouble swallowing, hoarseness, any shortness of air.    FNA in 2022 of nodules- one was benign (left isthmus) and one was non-diagnostic (left inferior). Recent ultrasound measures three distinct thyroid nodules- 7.6cm, 4.0cm and 4.6cm.  They also saw a rounded right lateral abnormal node.   TSH 1.4   Past Medical History: She  has a past medical history of Benign hypertension, Diabetes mellitus    (CMD), Elevated glucose level, Hypercholesterolemia, Nontoxic uninodular goiter, and Vitamin D deficiency.  Past head and neck radiation exposure: No, just dental xrays  Past Surgical History: Surgical History[1]  Prior neck surgery:  No  Medications: Current Medications[2] Allergies: Allergies[3]  Family History:  The patient family history includes Diabetes in her mother; Hypertension in her father.  Family history of thyroid disorders: Yes - Graves' in cousing and aunt Family history of thyroid cancer: No Family history of parathyroid or adrenal disease No  Social History:  The patient  reports that she has never smoked. She has never been exposed to tobacco smoke. She has never used smokeless tobacco.  The patient  reports no history of alcohol use.    Review of Systems: A comprehensive 14-point review of systems was negative except for stated in history.  Physical Exam: Vitals:   09/10/24 1024 09/10/24 1027  BP: 149/79 144/74  BP Location: Left arm Right arm  Patient Position: Sitting Sitting  Pulse: (!) 117   Resp: 16   Temp: 97.2 F (36.2 C)   TempSrc: Temporal   SpO2: 100%   Weight: 114 kg (252 lb)   Height: 1.727 m  (5' 8)    Constitutional: Well-appearing, no acute distress  Eyes: No proptosis or lid lag Thyroid: large palpable left lobe with deviation of the trachea rightward Ears, nose, throat: Hearing grossly intact, moist mucous membranes  Cardiovascular: Normal rate, appears well perfused Respiratory:   Symmetric expansion of the chest, non labored breathing Extremities: No lower extremity edema  Musculoskeletal: No tremor  Skin: No visible scars on the neck Psych: Normal mood and affect    In Office Ultrasound: An ultrasound of the neck was performed in clinic today for further evaluation.  Please see the ultrasound report for details. Briefly, the thyroid appeared large on the left with rightward tracheal deviation. The right side looked normal.   She did have an abnormal right lateral node.  FNA was performed today.    Assessment and recommendations: Sydney Mcclain has thyroid nodules.  I think left lobectomy is a reasonable option because the nodules are too large to rule out cancer with an FNA and she is having tracheal deviation although suprisingly no compressive symptoms except possibly a little sleep apnea. .    Risks include injury to the recurrent laryngeal nerve causing hoarseness with a permanent risk of around 1%, hypocalcemia from injury to the parathyroid glands with a permanent risk of about 2-3%, and a small risk of bleeding or infection.  In preparation for surgery, we will follow-up on the cytology from her LN biopsy.   The patient does wish to proceed with surgery and has given informed consent today.  I will certainly  keep you updated as to our operative findings, the patient's perioperative course, and the final pathology when that becomes available.  Please let me know if you have any questions or concerns regarding our visit today or our plans for surgery.    CC: Tammy Rachel Brought, MD  Electronically signed by:  Debby Fairy Chen, MD 09/10/2024 10:31  AM  Attestation (Dr. Jannis):  I personally confirmed the history, performed the examination and reviewed the data and imaging for Sydney Mcclain myself on the day of the service.  I saw the patient with the resident and agree with the findings and plan as documented.  Electronically signed by: Ilah LELON Jannis, MD 09/11/2024 1:49 PM          [1] Past Surgical History: Procedure Laterality Date  . CHOLECYSTECTOMY     Procedure: CHOLECYSTECTOMY  [2]  Current Outpatient Medications:  .  atorvastatin (LIPITOR) 10 mg tablet, TAKE 1 TABLET(10 MG) BY MOUTH EVERY EVENING FOR CHOLESTEROL, Disp: 30 tablet, Rfl: 5 .  cholecalciferol (VITAMIN D3) 1,000 unit (25 mcg) tablet, Take 1,000 Units by mouth daily., Disp: , Rfl:  .  cyanocobalamin (VITAMIN B12) 1,000 mcg tablet, Take 1,000 mcg by mouth daily., Disp: , Rfl:  .  multivitamin-min-iron-FA-vit K (Multi-Day Plus Minerals) 18 mg iron-400 mcg-25 mcg tab, Take 1 tablet by mouth Once Daily., Disp: , Rfl:  .  potassium chloride  20 mEq ER tablet, TAKE 1 TABLET(20 MEQ) BY MOUTH DAILY, Disp: 90 tablet, Rfl: 0 .  semaglutide (Rybelsus) 14 mg tab tablet, Take 1 tablet (14 mg total) by mouth daily., Disp: 30 tablet, Rfl: 11 .  triamterene-hydroCHLOROthiazide (MAXZIDE-25) 37.5-25 mg per tablet, TAKE 1 TABLET BY MOUTH DAILY, Disp: 90 tablet, Rfl: 1 [3] No Known Allergies

## 2024-09-10 NOTE — Telephone Encounter (Signed)
 Surgery marked ready to sched, pt notified   Surgery Date- 09/28/2024   Location- [facility]   Procedure - LOBECTOMY THYROID   PAT-  PAT order submitted   Noted to calendar  Patient notified
